# Patient Record
Sex: Female | Born: 1985 | Race: White | Hispanic: No | Marital: Married | Smoking: Former smoker
Health system: Southern US, Community
[De-identification: ages and names within clinical notes are randomized; demographics above are authoritative.]

## PROBLEM LIST (undated history)

## (undated) DIAGNOSIS — N200 Calculus of kidney: Secondary | ICD-10-CM

## (undated) DIAGNOSIS — K900 Celiac disease: Secondary | ICD-10-CM

## (undated) DIAGNOSIS — N840 Polyp of corpus uteri: Secondary | ICD-10-CM

## (undated) DIAGNOSIS — I10 Essential (primary) hypertension: Secondary | ICD-10-CM

## (undated) DIAGNOSIS — B999 Unspecified infectious disease: Secondary | ICD-10-CM

## (undated) DIAGNOSIS — U071 COVID-19: Secondary | ICD-10-CM

## (undated) DIAGNOSIS — N841 Polyp of cervix uteri: Secondary | ICD-10-CM

## (undated) DIAGNOSIS — J45909 Unspecified asthma, uncomplicated: Secondary | ICD-10-CM

## (undated) DIAGNOSIS — F419 Anxiety disorder, unspecified: Secondary | ICD-10-CM

## (undated) DIAGNOSIS — R519 Headache, unspecified: Secondary | ICD-10-CM

## (undated) DIAGNOSIS — D649 Anemia, unspecified: Secondary | ICD-10-CM

## (undated) HISTORY — PX: ADENOIDECTOMY: SUR15

## (undated) HISTORY — PX: FOOT SURGERY: SHX648

## (undated) HISTORY — PX: TONSILLECTOMY: SUR1361

## (undated) HISTORY — PX: HIP SURGERY: SHX245

---

## 2007-09-22 ENCOUNTER — Emergency Department (HOSPITAL_COMMUNITY): Admission: EM | Admit: 2007-09-22 | Discharge: 2007-09-23 | Payer: Self-pay | Admitting: Emergency Medicine

## 2008-06-19 ENCOUNTER — Encounter
Admission: RE | Admit: 2008-06-19 | Discharge: 2008-06-19 | Payer: Self-pay | Admitting: Physical Medicine and Rehabilitation

## 2012-07-19 ENCOUNTER — Encounter (HOSPITAL_COMMUNITY): Payer: Self-pay | Admitting: Emergency Medicine

## 2012-07-19 ENCOUNTER — Emergency Department (HOSPITAL_COMMUNITY): Payer: BC Managed Care – PPO

## 2012-07-19 ENCOUNTER — Emergency Department (HOSPITAL_COMMUNITY)
Admission: EM | Admit: 2012-07-19 | Discharge: 2012-07-19 | Disposition: A | Discharge status: Home / Self Care | Payer: BC Managed Care – PPO | Attending: Emergency Medicine | Admitting: Emergency Medicine

## 2012-07-19 DIAGNOSIS — Z880 Allergy status to penicillin: Secondary | ICD-10-CM | POA: Insufficient documentation

## 2012-07-19 DIAGNOSIS — J45909 Unspecified asthma, uncomplicated: Secondary | ICD-10-CM | POA: Insufficient documentation

## 2012-07-19 DIAGNOSIS — X500XXA Overexertion from strenuous movement or load, initial encounter: Secondary | ICD-10-CM | POA: Insufficient documentation

## 2012-07-19 DIAGNOSIS — Y9389 Activity, other specified: Secondary | ICD-10-CM | POA: Insufficient documentation

## 2012-07-19 DIAGNOSIS — Z79899 Other long term (current) drug therapy: Secondary | ICD-10-CM | POA: Insufficient documentation

## 2012-07-19 DIAGNOSIS — Z87891 Personal history of nicotine dependence: Secondary | ICD-10-CM | POA: Insufficient documentation

## 2012-07-19 DIAGNOSIS — S8990XA Unspecified injury of unspecified lower leg, initial encounter: Secondary | ICD-10-CM | POA: Insufficient documentation

## 2012-07-19 DIAGNOSIS — S99911A Unspecified injury of right ankle, initial encounter: Secondary | ICD-10-CM

## 2012-07-19 DIAGNOSIS — Y929 Unspecified place or not applicable: Secondary | ICD-10-CM | POA: Insufficient documentation

## 2012-07-19 HISTORY — DX: Unspecified asthma, uncomplicated: J45.909

## 2012-07-19 MED ORDER — TRAMADOL HCL 50 MG PO TABS: 50.0000 mg | ORAL_TABLET | Freq: Four times a day (QID) | ORAL | Status: DC | PRN

## 2012-07-19 MED ORDER — TRAMADOL HCL 50 MG PO TABS
50.0000 mg | ORAL_TABLET | Freq: Once | ORAL | Status: AC
  Administered 2012-07-19: 50 mg via ORAL
  Filled 2012-07-19: qty 1

## 2012-07-19 NOTE — ED Notes (Signed)
Pt escorted to discharge window. Verbalized understanding discharge instructions. In no acute distress.   

## 2012-07-19 NOTE — ED Provider Notes (Signed)
History     CSN: [id number]  Arrival date & time 07/19/12  0807   First MD Initiated Contact with Patient 07/19/12 [phone]      Chief Complaint  Patient presents with  . Ankle Injury    (Consider location/radiation/quality/duration/timing/severity/associated sxs/prior treatment) HPI Comments: Patient is a 27 year old female who presents with right ankle injury that occurred this morning. The mechanism of injury was sudden ankle inversion. Patient reports hearing a "pop" sudden onset of throbbing, severe pain that is localized to right ankle. Patient reports progressive worsening of pain. Ankle movement and weight bearing activity make the pain worse. Nothing makes the pain better. Patient reports associated swelling. Patient has not tried anything for pain relief. Patient denies obvious deformity, numbness/tingling, coolness/weakness of extremity, bruising, and any other injury.      Past Medical History  Diagnosis Date  . Asthma     Past Surgical History  Procedure Laterality Date  . Tonsillectomy    . Adenoidectomy      No family history on file.  History  Substance Use Topics  . Smoking status: Former Games developer  . Smokeless tobacco: Not on file  . Alcohol Use: No    OB History   Grav Para Term Preterm Abortions TAB SAB Ect Mult Living                  Review of Systems  Musculoskeletal: Positive for arthralgias.  All other systems reviewed and are negative.    Allergies  Avelox; Food; Penicillins; Sulfa antibiotics; and Biaxin  Home Medications   Current Outpatient Rx  Name  Route  Sig  Dispense  Refill  . citalopram (CELEXA) 20 MG tablet   Oral   Take 20 mg by mouth daily.         Marland Kitchen levocetirizine (XYZAL) 5 MG tablet   Oral   Take 5 mg by mouth every evening.         . norethindrone-ethinyl estradiol (NECON 0.5/35, 28,) 0.5-35 MG-MCG tablet   Oral   Take 1 tablet by mouth daily.           BP 140/73  Pulse 88  Temp(Src) 98.1 F (36.7 C)  (Oral)  Resp 20  SpO2 95%  LMP 06/28/2012  Physical Exam  Nursing note and vitals reviewed. Constitutional: She is oriented to person, place, and time. She appears well-developed and well-nourished. No distress.  HENT:  Head: Normocephalic and atraumatic.  Eyes: Conjunctivae are normal.  Neck: Normal range of motion.  Cardiovascular: Normal rate and regular rhythm.  Exam reveals no gallop and no friction rub.   No murmur heard. Pulmonary/Chest: Effort normal and breath sounds normal. She has no wheezes. She has no rales. She exhibits no tenderness.  Abdominal: Soft. There is no tenderness.  Musculoskeletal:  Right ankle ROM limited to due pain. Mild lateral malleolus swelling. Tenderness to palpation of right lateral malleolus. NO obvious deformity.   Neurological: She is alert and oriented to person, place, and time. Coordination normal.  Speech is goal-oriented. Moves limbs without ataxia.   Skin: Skin is warm and dry.    ED Course  Procedures (including critical care time)  Labs Reviewed - No data to display Dg Ankle Complete Right  07/19/2012   *RADIOLOGY REPORT*  Clinical Data: Recent traumatic injury with pain laterally  RIGHT ANKLE - COMPLETE 3+ VIEW  Comparison: 06/08/2009  Findings: A small well corticated density is noted adjacent to the distal aspect of the fibula unchanged from  prior exam.  This likely represents prior trauma.  No acute fracture or dislocation is noted.  No soft tissue changes are seen.  IMPRESSION: Chronic changes without acute abnormality.   Original Report Authenticated By: Alcide Clever, M.D.     No diagnosis found.    MDM  9:08 AM Ankle film unremarkable. Patient will have ASO ankle brace for support and have tramadol for pain. Patient instructed to keep ankle iced and elevated.         Emilia Beck, PA-C 07/19/12 0930

## 2012-07-19 NOTE — ED Notes (Signed)
Patient transported to X-ray 

## 2012-07-19 NOTE — ED Provider Notes (Signed)
Medical screening examination/treatment/procedure(s) were performed by non-physician practitioner and as supervising physician I was immediately available for consultation/collaboration.  Virgel Manifold, MD 07/19/12 [phone]

## 2012-07-19 NOTE — ED Notes (Signed)
Ortho at bedside.

## 2012-07-19 NOTE — ED Notes (Addendum)
Patient reports hearing a snap when she twisted her right ankle carrying laundry down the stairs this morning. Pedal pulses moderate 2+, no loss of sensation, no loss of movement, no swelling currently. Right lateral ankle is significantly warmer than the left ankle.

## 2012-07-19 NOTE — ED Notes (Signed)
PA at bedside.

## 2013-01-23 ENCOUNTER — Other Ambulatory Visit: Payer: Self-pay | Admitting: Family Medicine

## 2013-01-23 DIAGNOSIS — M549 Dorsalgia, unspecified: Secondary | ICD-10-CM

## 2013-01-24 ENCOUNTER — Ambulatory Visit
Admission: RE | Admit: 2013-01-24 | Discharge: 2013-01-24 | Disposition: A | Discharge status: Home / Self Care | Payer: BC Managed Care – PPO | Source: Ambulatory Visit | Attending: Family Medicine | Admitting: Family Medicine

## 2013-01-24 DIAGNOSIS — M549 Dorsalgia, unspecified: Secondary | ICD-10-CM

## 2013-06-29 ENCOUNTER — Ambulatory Visit
Admission: RE | Admit: 2013-06-29 | Discharge: 2013-06-29 | Disposition: A | Discharge status: Home / Self Care | Payer: BC Managed Care – PPO | Source: Ambulatory Visit | Attending: Family Medicine | Admitting: Family Medicine

## 2013-06-29 ENCOUNTER — Other Ambulatory Visit: Payer: Self-pay | Admitting: Family Medicine

## 2013-06-29 DIAGNOSIS — S0990XA Unspecified injury of head, initial encounter: Secondary | ICD-10-CM

## 2014-08-07 ENCOUNTER — Other Ambulatory Visit: Payer: Self-pay | Admitting: Family Medicine

## 2014-08-07 ENCOUNTER — Ambulatory Visit
Admission: RE | Admit: 2014-08-07 | Discharge: 2014-08-07 | Disposition: A | Discharge status: Home / Self Care | Payer: BC Managed Care – PPO | Source: Ambulatory Visit | Attending: Family Medicine | Admitting: Family Medicine

## 2014-08-07 DIAGNOSIS — M25475 Effusion, left foot: Secondary | ICD-10-CM

## 2014-08-07 DIAGNOSIS — M79672 Pain in left foot: Secondary | ICD-10-CM

## 2014-10-15 ENCOUNTER — Telehealth: Payer: Self-pay | Admitting: Cardiovascular Disease

## 2014-10-15 NOTE — Telephone Encounter (Signed)
Received records from [place] for appointment with Dr Oval Linsey on 11/06/14.  Records given to [facility] (medical records) for Dr Blenda Mounts schedule on 11/06/14. lp

## 2014-11-04 ENCOUNTER — Ambulatory Visit: Payer: BC Managed Care – PPO | Admitting: Cardiovascular Disease

## 2014-11-06 ENCOUNTER — Ambulatory Visit (INDEPENDENT_AMBULATORY_CARE_PROVIDER_SITE_OTHER): Payer: BC Managed Care – PPO | Admitting: Cardiovascular Disease

## 2014-11-06 ENCOUNTER — Encounter (INDEPENDENT_AMBULATORY_CARE_PROVIDER_SITE_OTHER): Payer: BC Managed Care – PPO

## 2014-11-06 ENCOUNTER — Telehealth: Payer: Self-pay | Admitting: Cardiovascular Disease

## 2014-11-06 ENCOUNTER — Encounter: Payer: Self-pay | Admitting: Cardiovascular Disease

## 2014-11-06 VITALS — BP 122/78 | HR 58 | Ht 70.0 in | Wt 233.0 lb

## 2014-11-06 DIAGNOSIS — R0602 Shortness of breath: Secondary | ICD-10-CM

## 2014-11-06 DIAGNOSIS — R011 Cardiac murmur, unspecified: Secondary | ICD-10-CM | POA: Diagnosis not present

## 2014-11-06 DIAGNOSIS — R002 Palpitations: Secondary | ICD-10-CM | POA: Diagnosis not present

## 2014-11-06 NOTE — Patient Instructions (Signed)
LABS- CBC,BMP [place] physician has recommended that you wear an event monitor FOR 7 DAYS . Event monitors are medical devices that record the heart's electrical activity. Doctors most often [place] these monitors to diagnose arrhythmias. Arrhythmias are problems with the speed or rhythm of the heartbeat. The monitor is a small, portable device. You can wear one while you do your normal daily activities. This is usually used to diagnose what is causing palpitations/syncope (passing out).  Your physician has requested that you have an echocardiogram AT [place] [facility]. Marland Kitchen Echocardiography is a painless test that uses sound waves to create images of your heart. It provides your doctor with information about the size and shape of your heart and how well your heart's chambers and valves are working. This procedure takes approximately one hour. There are no restrictions for this procedure.  WILL CONTACT YOU WITH RESULTS.  Your physician wants you to follow-up in [place]

## 2014-11-06 NOTE — Progress Notes (Signed)
Cardiology Office Note   Date:  11/06/2014   ID:  [name], DOB 1985-11-11, MRN [id number]  PCP:  No primary care provider on file.  Cardiologist:   Sharol Harness, MD   No chief complaint on file.     History of Present Illness: [name] is a 29 y.o. female with tobacco abuse and asthma who presents for an evaluation of palpitations and chest pain. She reports episodes of chest pain that is been occurring intermittently for as long as she can remember. She reports that the chest pain has been occurring more frequently in the last 2 months.  She describes it as a substernal, sharp/stabbing 9 out of 10 chest pain.  It is associated with palpitations and shortness of breath. She denies any lightheadedness, dizziness, nausea or diaphoresis. 2 months ago she had a particularly severe episode that occurred several times in one day. Typically occurs once every 3 or 4 days. She mentioned this to her OB/GYN, Dr. Chalmers Cater, who referred her to cardiology for further evaluation.  [name] is very physically active. She exercises several times a week by lifting weights 2-3 times per week, doing yoga once per week, and doing cardio one to 2 times per week. She has been watching her diet and eating healthfully. She drinks one cup of coffee daily but does not have any other intake of caffeine throughout the day.  The episodes are new or more nor less common with exercise  [name]'s mother has a history of mitral valve prolapse and had a TIA at age 28. She also has hyperlipidemia. Her maternal grandfather had a heart attack at age 50.   Past Medical History  Diagnosis Date  . Asthma     Past Surgical History  Procedure Laterality Date  . Tonsillectomy    . Adenoidectomy       Current Outpatient Prescriptions  Medication Sig Dispense Refill  . citalopram (CELEXA) 20 MG tablet Take 20 mg by mouth daily.    Marland Kitchen levocetirizine (XYZAL) 5 MG tablet Take 5 mg by mouth every  evening.    . norethindrone-ethinyl estradiol (NECON 0.5/35, 28,) 0.5-35 MG-MCG tablet Take 1 tablet by mouth daily.    . traMADol (ULTRAM) 50 MG tablet Take 1 tablet (50 mg total) by mouth every 6 (six) hours as needed for pain. 15 tablet 0   No current facility-administered medications for this visit.    Allergies:   Avelox; Food; Penicillins; Sulfa antibiotics; and Biaxin    Social History:  The patient  reports that she has quit smoking. She does not have any smokeless tobacco history on file. She reports that she does not drink alcohol or use illicit drugs.   Family History:  The patient's family history is not on file.    ROS:  Please see the history of present illness.   Otherwise, review of systems are positive for none.   All other systems are reviewed and negative.    PHYSICAL EXAM: VS:  There were no vitals taken for this visit. , BMI There is no height or weight on file to calculate BMI. GENERAL:  Well appearing HEENT:  Pupils equal round and reactive, fundi not visualized, oral mucosa unremarkable NECK:  No jugular venous distention, waveform within normal limits, carotid upstroke brisk and symmetric, no bruits, no thyromegaly LYMPHATICS:  No cervical adenopathy LUNGS:  Clear to auscultation bilaterally HEART:  RRR.  PMI not displaced or sustained,S1 and S2 within normal limits, no  S3, no S4, no clicks, no rubs, II/IV diastolic murmur at the LUSB. ABD:  Flat, positive bowel sounds normal in frequency in pitch, no bruits, no rebound, no guarding, no midline pulsatile mass, no hepatomegaly, no splenomegaly EXT:  2 plus pulses throughout, no edema, no cyanosis no clubbing SKIN:  No rashes no nodules NEURO:  Cranial nerves II through XII grossly intact, motor grossly intact throughout PSYCH:  Cognitively intact, oriented to person place and time    EKG:  EKG is ordered today. The ekg ordered today demonstrates sinus bradycardia at 58 bpm.     Recent Labs: No results  found for requested labs within last 365 days.    Lipid Panel No results found for: CHOL, TRIG, HDL, CHOLHDL, VLDL, LDLCALC, LDLDIRECT    Wt Readings from Last 3 Encounters:  No data found for Wt     ASSESSMENT AND PLAN:  # Palpitations:  Based on her symptoms it sounds like she has PACs or PVCs.  Will obtain a 7 day Event Monitor to assess the underlying rhythm at the time when she notes symptoms.  Will also obtain a BMP, CBC, magnesium and thyroid function.  # Diastolic murmur: [name] has a mild diastolic murmur consistent with AR.  She does not have any signs of heart failure.  She has a family history of MVP, which could explain some of her symptoms.  This is inconsistent with the murmur heart on  Exam.  Wil refer for echocardiography to assess for structural heart disease.  # Chest pain: Symptoms are very atypical and not consistent with ischemia.  She has experienced this since childhood and it is not exacerbated by exercise.  Will evaluate for mitral valve prolapse as above.   Current medicines are reviewed at length with the patient today.  The patient does not have concerns regarding medicines.  The following changes have been made:  no change  Labs/ tests ordered today include:  No orders of the defined types were placed in this encounter.     Disposition:   FU with Laval Cafaro C. Oval Linsey, MD in 3 months.   Signed, Sharol Harness, MD  11/06/2014 8:20 AM    [facility]

## 2014-11-06 NOTE — Telephone Encounter (Signed)
Questions regarding insurance answered

## 2014-11-13 LAB — MAGNESIUM: Magnesium: 1.8 mg/dL (ref 1.5–2.5)

## 2014-11-13 LAB — BASIC METABOLIC PANEL
BUN: 13 mg/dL (ref 7–25)
CO2: 29 mmol/L (ref 20–31)
Calcium: 9.1 mg/dL (ref 8.6–10.2)
Chloride: 99 mmol/L (ref 98–110)
Creat: 0.68 mg/dL (ref 0.50–1.10)
Glucose, Bld: 88 mg/dL (ref 65–99)
Potassium: 4.4 mmol/L (ref 3.5–5.3)
Sodium: 135 mmol/L (ref 135–146)

## 2014-11-13 LAB — CBC
HCT: 35.8 % — ABNORMAL LOW (ref 36.0–46.0)
Hemoglobin: 11.9 g/dL — ABNORMAL LOW (ref 12.0–15.0)
MCH: 30.3 pg (ref 26.0–34.0)
MCHC: 33.2 g/dL (ref 30.0–36.0)
MCV: 91.1 fL (ref 78.0–100.0)
MPV: 11 fL (ref 8.6–12.4)
Platelets: 223 10*3/uL (ref 150–400)
RBC: 3.93 MIL/uL (ref 3.87–5.11)
RDW: 13.2 % (ref 11.5–15.5)
WBC: 7.4 10*3/uL (ref 4.0–10.5)

## 2014-11-13 LAB — TSH: TSH: 2.279 u[IU]/mL (ref 0.350–4.500)

## 2014-11-14 ENCOUNTER — Telehealth: Payer: Self-pay | Admitting: *Deleted

## 2014-11-14 NOTE — Telephone Encounter (Signed)
Spoke to patient. Result given . Verbalized understanding  

## 2014-11-14 NOTE — Telephone Encounter (Signed)
LEFT MESSAGE TO CALL BACK WITH MOTHER

## 2014-11-14 NOTE — Telephone Encounter (Signed)
-----   Message from Chilton Si, MD sent at 11/13/2014  6:50 PM EDT ----- Labs are all normal other than mild anemia.

## 2014-11-18 ENCOUNTER — Other Ambulatory Visit: Payer: Self-pay

## 2014-11-18 ENCOUNTER — Ambulatory Visit (HOSPITAL_COMMUNITY): Payer: BC Managed Care – PPO | Attending: Cardiovascular Disease

## 2014-11-18 DIAGNOSIS — R0602 Shortness of breath: Secondary | ICD-10-CM

## 2014-11-18 DIAGNOSIS — R002 Palpitations: Secondary | ICD-10-CM

## 2014-11-18 DIAGNOSIS — I071 Rheumatic tricuspid insufficiency: Secondary | ICD-10-CM | POA: Diagnosis not present

## 2014-11-18 DIAGNOSIS — F17200 Nicotine dependence, unspecified, uncomplicated: Secondary | ICD-10-CM | POA: Insufficient documentation

## 2014-11-18 DIAGNOSIS — R011 Cardiac murmur, unspecified: Secondary | ICD-10-CM | POA: Insufficient documentation

## 2014-11-18 DIAGNOSIS — I351 Nonrheumatic aortic (valve) insufficiency: Secondary | ICD-10-CM | POA: Insufficient documentation

## 2014-11-20 ENCOUNTER — Telehealth: Payer: Self-pay | Admitting: *Deleted

## 2014-11-20 NOTE — Telephone Encounter (Signed)
Spoke to mother   cell phone number given [phone] Spoke to patient- result given  Verbalized understanding. Appointment 11/25/14 3:30 pm

## 2014-11-20 NOTE — Telephone Encounter (Signed)
-----   Message from Chilton Si, MD sent at 11/19/2014  5:25 PM EDT ----- Pressure slightly elevated in lungs.  Schedule follow up next available.

## 2014-11-20 NOTE — Telephone Encounter (Signed)
-----   Message from Chilton Si, MD sent at 11/19/2014  5:23 PM EDT ----- Normal monitor.  No significant arrhythmias.

## 2014-11-25 ENCOUNTER — Encounter: Payer: Self-pay | Admitting: Cardiovascular Disease

## 2014-11-25 ENCOUNTER — Ambulatory Visit (INDEPENDENT_AMBULATORY_CARE_PROVIDER_SITE_OTHER): Payer: BC Managed Care – PPO | Admitting: Cardiovascular Disease

## 2014-11-25 VITALS — BP 114/62 | HR 72 | Ht 70.0 in | Wt 232.0 lb

## 2014-11-25 DIAGNOSIS — I272 Other secondary pulmonary hypertension: Secondary | ICD-10-CM | POA: Diagnosis not present

## 2014-11-25 DIAGNOSIS — Z148 Genetic carrier of other disease: Secondary | ICD-10-CM

## 2014-11-25 DIAGNOSIS — Z86718 Personal history of other venous thrombosis and embolism: Secondary | ICD-10-CM

## 2014-11-25 DIAGNOSIS — E8801 Alpha-1-antitrypsin deficiency: Secondary | ICD-10-CM

## 2014-11-25 NOTE — Progress Notes (Signed)
Cardiology Office Note   Date:  11/25/2014   ID:  [name], DOB Sep 12, 1985, MRN [id number]  PCP:  Anthoney Harada, MD  Cardiologist:   Sharol Harness, MD   No chief complaint on file.     Interval History: [name] is a 29 y.o. female with asthma and prior tobacco abuse who presents for follow up on an abnormal echo.  [name] was seen in clinic on 10/5 with a complaint of palpitations and chest pain.  She was referred for a 7 day event monitor that showed no arrhythmias.  She also had an echo that showed mildly elevated PA pressure (PASP 38 mmHg) and a dilated IVC.  She has been doing well since her last appointment. She had one episode of palpitations but it was not as severe as previous episodes. She denies any chest pain, shortness of breath, orthopnea, PND, or lower extremity edema.  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 She also reports a DVT 10 years ago when she was in college. The source of her DVT was unknown.  Past Medical History  Diagnosis Date  . Asthma     Past Surgical History  Procedure Laterality Date  . Tonsillectomy    . Adenoidectomy       Current Outpatient Prescriptions  Medication Sig Dispense Refill  . citalopram (CELEXA) 20 MG tablet Take 20 mg by mouth daily.    Marland Kitchen loratadine (CLARITIN) 10 MG tablet Take 10 mg by mouth daily.    . norethindrone-ethinyl estradiol (NECON 0.5/35, 28,) 0.5-35 MG-MCG tablet Take 1 tablet by mouth daily.     No current facility-administered medications for this visit.    Allergies:   Avelox; Food; Penicillins; Sulfa antibiotics; and Biaxin    Social History:  The patient  reports that she has quit smoking. She does not have any smokeless tobacco history on file. She reports that she does not drink alcohol or use illicit drugs.   Family History:  The patient's family history includes Alzheimer's  disease in her maternal grandmother; Diabetes in her maternal grandmother; Emphysema in her maternal grandfather; Healthy in her brother and father; Heart attack (age of onset: 74) in her maternal grandfather; Hyperlipidemia in her maternal grandmother; Hyperlipidemia (age of onset: 46) in her mother; Hypertension in her paternal grandmother; Kidney disease in her maternal grandmother; Mitral valve prolapse in her mother; Other in her paternal grandmother; Skin cancer in her paternal grandfather and paternal grandmother; Thyroid disease in her mother and sister; Transient ischemic attack (age of onset: 66) in her mother.    ROS:  Please see the history of present illness.   Otherwise, review of systems are positive for none.   All other systems are reviewed and negative.    PHYSICAL EXAM: VS:  BP 114/62 mmHg  Pulse 72  Ht 5' 10"  (1.778 m)  Wt 105.235 kg (232 lb)  BMI 33.29 kg/m2 , BMI Body mass index is 33.29 kg/(m^2). GENERAL:  Well appearing HEENT:  Pupils equal round and reactive, fundi not visualized, oral mucosa unremarkable NECK:  No jugular venous distention, waveform within normal limits, carotid upstroke brisk and symmetric, no bruits, no thyromegaly LYMPHATICS:  No cervical adenopathy LUNGS:  Clear to auscultation bilaterally HEART:  RRR.  PMI not displaced or sustained,S1 and S2 within normal limits, no S3, no S4, no clicks, no rubs, II/IV diastolic murmur at the LUSB. ABD:  Flat, positive bowel sounds normal in frequency in pitch, no bruits, no rebound, no guarding, no midline pulsatile mass, no hepatomegaly, no splenomegaly EXT:  2 plus pulses throughout, no edema, no cyanosis no clubbing SKIN:  No rashes no nodules NEURO:  Cranial nerves II through XII grossly intact, motor grossly intact throughout PSYCH:  Cognitively intact, oriented to person place and time    EKG:  EKG is not ordered today.   Recent Labs: 11/12/2014: BUN 13; Creat 0.68; Hemoglobin 11.9*; Magnesium 1.8;  Platelets 223; Potassium 4.4; Sodium 135; TSH 2.279    Lipid Panel No results found for: CHOL, TRIG, HDL, CHOLHDL, VLDL, LDLCALC, LDLDIRECT    Wt Readings from Last 3 Encounters:  11/25/14 105.235 kg (232 lb)  11/06/14 105.688 kg (233 lb)     ASSESSMENT AND PLAN:  # Palpitations:  Ambulatory monitoring was unremarkable.   # Pulmonary hypertension: [name] was noted to have mild pulmonary hypertension with peak systolic pressure of 38 mmHg. She has several potential causes for this.  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 She also has a history of DVT. We will obtain a CT angiogram to rule out PE and also a CT without contrast to look at her lung parenchyma. We will obtain pulmonary function test to assess for pulmonary disease. We will also check an ESR, ANA, and HIV test. Her TSH was normal.     Current medicines are reviewed at length with the patient today.  The patient does not have concerns regarding medicines.  The following changes have been made:  no change  Labs/ tests ordered today include:   Orders Placed This Encounter  Procedures  . CT Angio Chest W/Cm &/Or Wo Cm  . Sedimentation rate  . Antinuclear Antib (ANA)  . HIV antibody (with reflex)  . Pulmonary Function Test     Disposition:   FU with Marialuiza Car C. Oval Linsey, MD in 1 year.   Signed, Sharol Harness, MD  11/25/2014 3:57 PM    [place]

## 2014-11-25 NOTE — Patient Instructions (Signed)
Your physician has recommended that you have a pulmonary function test. Pulmonary Function Tests are a group of tests that measure how well air moves in and out of your lungs.  Your physician has recommended that you have a non-Cardiac CT Angiography (CTA), is a special type of CT scan that uses a computer to produce multi-dimensional views of major blood vessels throughout the body. In CT angiography, a contrast material is injected through an IV to help visualize the blood vessels.  Your physician recommends that you return for lab work TODAY.  Dr Duke Salvia recommends that you schedule a follow-up appointment in 1 year. You will receive a reminder letter in the mail two months in advance. If you don't receive a letter, please call our office to schedule the follow-up appointment.

## 2014-11-26 ENCOUNTER — Ambulatory Visit (INDEPENDENT_AMBULATORY_CARE_PROVIDER_SITE_OTHER)
Admission: RE | Admit: 2014-11-26 | Discharge: 2014-11-26 | Disposition: A | Discharge status: Home / Self Care | Payer: BC Managed Care – PPO | Source: Ambulatory Visit | Attending: Cardiovascular Disease | Admitting: Cardiovascular Disease

## 2014-11-26 ENCOUNTER — Telehealth: Payer: Self-pay | Admitting: Cardiovascular Disease

## 2014-11-26 DIAGNOSIS — Z148 Genetic carrier of other disease: Secondary | ICD-10-CM

## 2014-11-26 DIAGNOSIS — Z86718 Personal history of other venous thrombosis and embolism: Secondary | ICD-10-CM | POA: Diagnosis not present

## 2014-11-26 DIAGNOSIS — I272 Other secondary pulmonary hypertension: Secondary | ICD-10-CM | POA: Diagnosis not present

## 2014-11-26 DIAGNOSIS — E8801 Alpha-1-antitrypsin deficiency: Secondary | ICD-10-CM

## 2014-11-26 LAB — SEDIMENTATION RATE: Sed Rate: 6 mm/hr (ref 0–20)

## 2014-11-26 LAB — ANA: Anti Nuclear Antibody(ANA): NEGATIVE

## 2014-11-26 LAB — HIV ANTIBODY (ROUTINE TESTING W REFLEX): HIV 1&2 Ab, 4th Generation: NONREACTIVE

## 2014-11-26 MED ORDER — IOHEXOL 350 MG/ML SOLN
80.0000 mL | Freq: Once | INTRAVENOUS | Status: AC | PRN
  Administered 2014-11-26: 80 mL via INTRAVENOUS

## 2014-11-26 NOTE — Telephone Encounter (Signed)
Pt had CT of chest done today. Results are ready

## 2014-11-26 NOTE — Telephone Encounter (Signed)
Marzetta Board called in stating that the results to the PE were available. Please f/u if you need to  Thank

## 2014-11-27 ENCOUNTER — Telehealth: Payer: Self-pay | Admitting: *Deleted

## 2014-11-27 NOTE — Telephone Encounter (Signed)
-----   Message from Chilton Si, MD sent at 11/26/2014  5:03 PM EDT ----- Normal lung tissue, no PE and pulmonary artery is the normal size.  Normal study.

## 2014-11-27 NOTE — Telephone Encounter (Signed)
SPOKE TO PATIENT   SHE ASKED A QUESTION CONCERNING HER DIAGNOSIS  OF PULM. HTN SHE STATES SHE HAS DONE SOME RESEARCH. SHE WANTED TO KNOW IF SHE CAN GO THROUGH WITH A PREGNANCY.  RN INFORMED HER WILL HAVE TO DEFER TO DR [place]. WILL CONTACT HER WITH ANSWER. SHE VERBALIZED UNDERSTANDING. PATIENT ALSO REQUEST ALL REPORT AND NOTE BE SENT TO DR LAVOIE - GYN AS WELL-( INFO WAS ROUTED)

## 2014-11-27 NOTE — Telephone Encounter (Signed)
-----   Message from Skeet Latch, MD sent at 11/26/2014  5:09 PM EDT ----- Normal labs.  There is no evidence of autoimmune disease.  HIV negative.

## 2014-11-27 NOTE — Telephone Encounter (Signed)
Spoke to patient. CT SCN AND LABS Result given . Verbalized understanding Patient request information to be sent gyn- Dr Roseanne Reno Routed information

## 2014-11-28 NOTE — Telephone Encounter (Signed)
Her pulmonary pressures are very mildly elevated.  They are not high enough to prevent her from having a safe pregnancy.  We would monitor her more closely if she were to become pregnant, but she does not have to avoid pregnancy.  I will be happy to forward my note to Dr. Dellis Filbert.

## 2014-11-29 NOTE — Telephone Encounter (Signed)
Spoke to patient - information given.  Verbalized understanding 

## 2014-12-02 ENCOUNTER — Ambulatory Visit (HOSPITAL_COMMUNITY)
Admission: RE | Admit: 2014-12-02 | Discharge: 2014-12-02 | Disposition: A | Discharge status: Home / Self Care | Payer: BC Managed Care – PPO | Source: Ambulatory Visit | Attending: Cardiovascular Disease | Admitting: Cardiovascular Disease

## 2014-12-02 DIAGNOSIS — I272 Other secondary pulmonary hypertension: Secondary | ICD-10-CM | POA: Diagnosis not present

## 2014-12-02 DIAGNOSIS — Z148 Genetic carrier of other disease: Secondary | ICD-10-CM

## 2014-12-02 DIAGNOSIS — E8801 Alpha-1-antitrypsin deficiency: Secondary | ICD-10-CM | POA: Insufficient documentation

## 2014-12-02 LAB — PULMONARY FUNCTION TEST
DL/VA % pred: 82 %
DL/VA: 4.44 ml/min/mmHg/L
DLCO unc % pred: 86 %
DLCO unc: 27.52 ml/min/mmHg
FEF 25-75 Post: 2.56 L/sec
FEF 25-75 Pre: 1.88 L/sec
FEF2575-%Change-Post: 36 %
FEF2575-%Pred-Post: 68 %
FEF2575-%Pred-Pre: 49 %
FEV1-%Change-Post: 10 %
FEV1-%Pred-Post: 88 %
FEV1-%Pred-Pre: 80 %
FEV1-Post: 3.31 L
FEV1-Pre: 3 L
FEV1FVC-%Change-Post: 8 %
FEV1FVC-%Pred-Pre: 75 %
FEV6-%Change-Post: 0 %
FEV6-%Pred-Post: 103 %
FEV6-%Pred-Pre: 104 %
FEV6-Post: 4.59 L
FEV6-Pre: 4.6 L
FEV6FVC-%Change-Post: 2 %
FEV6FVC-%Pred-Post: 100 %
FEV6FVC-%Pred-Pre: 98 %
FVC-%Change-Post: 1 %
FVC-%Pred-Post: 107 %
FVC-%Pred-Pre: 105 %
FVC-Post: 4.81 L
FVC-Pre: 4.72 L
Post FEV1/FVC ratio: 69 %
Post FEV6/FVC ratio: 100 %
Pre FEV1/FVC ratio: 64 %
Pre FEV6/FVC Ratio: 97 %
RV % pred: 98 %
RV: 1.66 L
TLC % pred: 106 %
TLC: 6.26 L

## 2014-12-02 MED ORDER — ALBUTEROL SULFATE (2.5 MG/3ML) 0.083% IN NEBU
2.5000 mg | INHALATION_SOLUTION | Freq: Once | RESPIRATORY_TRACT | Status: AC
  Administered 2014-12-02: 2.5 mg via RESPIRATORY_TRACT

## 2014-12-05 ENCOUNTER — Telehealth: Payer: Self-pay | Admitting: *Deleted

## 2014-12-05 DIAGNOSIS — R942 Abnormal results of pulmonary function studies: Secondary | ICD-10-CM

## 2014-12-05 NOTE — Telephone Encounter (Signed)
[name] is returning your call . Please call

## 2014-12-05 NOTE — Telephone Encounter (Signed)
-----   Message from Skeet Latch, MD sent at 12/05/2014  6:32 AM EDT ----- Mild airway obstruction, but did not respond to bronchodilators.  Please schedule follow up with pulmonology.

## 2014-12-05 NOTE — Telephone Encounter (Signed)
Left message to call back  

## 2014-12-05 NOTE — Telephone Encounter (Signed)
Spoke to patient. Result given . Verbalized understanding Patient aware pulmonology consult has been ordered. Routed  PFT information to Dr Mariane Duval patient request.

## 2014-12-23 ENCOUNTER — Encounter: Payer: Self-pay | Admitting: Internal Medicine

## 2014-12-23 ENCOUNTER — Ambulatory Visit (INDEPENDENT_AMBULATORY_CARE_PROVIDER_SITE_OTHER): Payer: BC Managed Care – PPO | Admitting: Internal Medicine

## 2014-12-23 VITALS — BP 132/74 | HR 78 | Ht 69.5 in | Wt 236.8 lb

## 2014-12-23 DIAGNOSIS — E669 Obesity, unspecified: Secondary | ICD-10-CM | POA: Diagnosis not present

## 2014-12-23 DIAGNOSIS — J4530 Mild persistent asthma, uncomplicated: Secondary | ICD-10-CM

## 2014-12-23 DIAGNOSIS — K219 Gastro-esophageal reflux disease without esophagitis: Secondary | ICD-10-CM

## 2014-12-23 DIAGNOSIS — I272 Other secondary pulmonary hypertension: Secondary | ICD-10-CM

## 2014-12-23 MED ORDER — FAMOTIDINE 20 MG PO TABS: ORAL_TABLET | ORAL | Status: DC

## 2014-12-23 MED ORDER — PANTOPRAZOLE SODIUM 40 MG PO TBEC: 40.0000 mg | DELAYED_RELEASE_TABLET | Freq: Every day | ORAL | Status: DC

## 2014-12-23 NOTE — Assessment & Plan Note (Addendum)
-   PFT's  12/02/2014  FEV1 3.31 (88 % ) ratio 69  p 10 % improvement from saba with DLCO  86 % corrects to 82 % for alv volume    With fev 1 > 80% this is either mild chronic asthma vs GOLD I copd but either way rx is the same using rule of 2's to guide rx   12/23/2014  extensive coaching HFA effectiveness =    90%   I had an extended discussion with the patient reviewing all relevant studies completed to date and  lasting 35  minutes of a 60 minute visit    1) all of her remaining symptoms are more likely related to GERD than cardiac/ pulm origin (see GERD)  2)  I  reviewed the Fletcher curve with the patient that basically indicates  if you quit smoking when your best day FEV1 is still well preserved (as is clearly  the case here)  it is highly unlikely you will progress to severe disease and informed the patient there was no medication on the market that has proven to alter the curve/ its downward trajectory  or the likelihood of progression of their disease.  Therefore stopping smoking and maintaining abstinence is the most important aspect of care, not choice of inhalers or for that matter, doctors.   So no need for anything but prn saba for now.  2) Each maintenance medication was reviewed in detail including most importantly the difference between maintenance and prns and under what circumstances the prns are to be triggered using an action plan format that is not reflected in the computer generated alphabetically organized AVS.    Please see instructions for details which were reviewed in writing and the patient given a copy highlighting the part that I personally wrote and discussed at today's ov.

## 2014-12-23 NOTE — Assessment & Plan Note (Signed)
Clinical dx with atypical CP > rx started 12/23/2014   This may well turn out to be the source of many of her chest symptoms and she is at risk based on wt, fm hx/ use of BCPS, reviewed   rec min of 4 week rx with ppi/ hs h2 and diet   See instructions for specific recommendations which were reviewed directly with the patient who was given a copy with highlighter outlining the key components.

## 2014-12-23 NOTE — Patient Instructions (Addendum)
Pantoprazole (protonix) 40 mg   Take  30-60 min before first meal of the day and Pepcid (famotidine)  20 mg one @  bedtime for at least a month to see which if any of your symptoms improve vs now but especially your chest pains  GERD (REFLUX)  is an extremely common cause of respiratory symptoms just like yours , many times with no obvious heartburn at all.    It can be treated with medication, but also with lifestyle changes including elevation of the head of your bed (ideally with 6 inch  bed blocks),  Smoking cessation, avoidance of late meals, excessive alcohol, and avoid fatty foods, chocolate, peppermint, colas, red wine, and acidic juices such as orange juice.  NO MINT OR MENTHOL PRODUCTS SO NO COUGH DROPS  USE SUGARLESS CANDY INSTEAD (Jolley ranchers or Stover's or Life Savers) or even ice chips will also do - the key is to swallow to prevent all throat clearing. NO OIL BASED VITAMINS - use powdered substitutes.   Weight control is simply a matter of calorie balance which needs to be tilted in your favor by eating less and exercising more.  To get the most out of exercise, you need to be continuously aware that you are short of breath, but never out of breath, for 30 minutes daily. As you improve, it will actually be easier for you to do the same amount of exercise  in  30 minutes so always push to the level where you are short of breath.  If this does not result in gradual weight reduction then I strongly recommend you see a nutritionist with a food diary x 2 weeks so that we can work out a negative calorie balance which is universally effective in steady weight loss programs.  Think of your calorie balance like you do your bank account where in this case you want the balance to go down so you must take in less calories than you burn up.  It's just that simple:  Hard to do, but easy to understand.  Good luck!   Consider alternative to your present bcp's   Please schedule a follow up visit in  3 months but call sooner if needed if need the rescue more than twice weekly

## 2014-12-23 NOTE — Assessment & Plan Note (Signed)
Echo 11/18/14 >    PAS 38 CTa > neg PE  PFTs 12/02/14 nl dlco   She has extremely mild PH and my main concern is that she maintain off cigs with no further w/u for now and would only repeat the echo if she fails to achieve better ex tol (she hasn't tried yet due to foot injury) and she loses wt.

## 2014-12-23 NOTE — Assessment & Plan Note (Signed)
Body mass index is 34.48    Lab Results  Component Value Date   TSH 2.279 11/12/2014     Contributing to gerd tendency/ doe/reviewed the need and the process to achieve and maintain neg calorie balance > defer f/u primary care including intermittently monitoring thyroid status

## 2014-12-23 NOTE — Progress Notes (Signed)
Subjective:     Patient ID: [name], female   DOB: April 27, 1985,     MRN: [id number]  HPI  29 yowf with lots of childhood sinus/ear infections req tubes/ adenoidectomy and allergy eval in [place] on daily inhalers since around 29 y old  for dx of asthma "outgrew the need for maint inhalers by age 29" and shots as well both as child and young adult but stopped early 29s and smoked age [age] quit around 12/03/14 with onset of worse cp/palpitations since Aug 2016 > w/u mild PH, abn pfts so referred to pulmonary clinic 12/23/2014 by cards/ Tiffany [place]/primary Wong   12/23/2014 1st [facility] office visit/ Wert   Chief Complaint  Patient presents with  . Pulmonary Consult    Referred by Dr. Skeet Latch. Pt states she was dxed with mild pulmonary hypertension approx 3 wks ago. She states that she has been having CP, SOB and cough since August 2016.  She gets SOB walking up stairs and when exposed to cold air.   rarely needs rescue, some more x one week with smoke exp and assoc min dry cough. Cp is midline, worse with cough s dysphagia or overt hb or radiation n or v or diaphoresis - sob sometimes better with saba and sometimes not    No obvious day to day or daytime variability or assoc chest tightness, subjective wheeze or overt sinus or hb symptoms. No unusual exp hx or h/o childhood pna/  or knowledge of premature birth.  Sleeping ok without nocturnal  or early am exacerbation  of respiratory  c/o's or need for noct saba. Also denies any obvious fluctuation of symptoms with weather or environmental changes or other aggravating or alleviating factors except as outlined above   Current Medications, Allergies, Complete Past Medical History, Past Surgical History, Family History, and Social History were reviewed in [employer] record.  ROS  The following are not active complaints unless bolded sore throat, dysphagia, dental problems, itching, sneezing,  nasal  congestion or excess/ purulent secretions, ear ache,   fever, chills, sweats, unintended wt loss, classically lateralizing  pleuritic or exertional cp, hemoptysis,  orthopnea pnd or leg swelling, presyncope, palpitations, abdominal pain, anorexia, nausea, vomiting, diarrhea  or change in bowel or bladder habits, change in stools or urine, dysuria,hematuria,  rash, arthralgias, visual complaints, headache, numbness, weakness or ataxia or problems with walking or coordination,  change in mood/affect or memory.                 Review of Systems     Objective:   Physical Exam   amb wf nad  Wt Readings from Last 3 Encounters:  12/23/14 236 lb 12.8 oz (107.412 kg)  11/25/14 232 lb (105.235 kg)  11/06/14 233 lb (105.688 kg)    Vital signs reviewed   HEENT: nl dentition, turbinates, and oropharynx. Nl external ear canals without cough reflex   NECK :  without JVD/Nodes/TM/ nl carotid upstrokes bilaterally   LUNGS: no acc muscle use, clear to A and P bilaterally without cough on insp or exp maneuvers   CV:  RRR  no s3 or murmur or increase in P2, no edema   ABD:  soft and nontender with nl excursion in the supine position. No bruits or organomegaly, bowel sounds nl  MS:  warm without deformities, calf tenderness, cyanosis or clubbing  SKIN: warm and dry without lesions    NEURO:  alert, approp, no deficits  CTa 11/26/14 No evidence of pulmonary embolism. Main pulmonary artery is normal in size.    Assessment:

## 2015-02-19 ENCOUNTER — Ambulatory Visit (INDEPENDENT_AMBULATORY_CARE_PROVIDER_SITE_OTHER): Payer: BC Managed Care – PPO | Admitting: Cardiovascular Disease

## 2015-02-19 ENCOUNTER — Encounter: Payer: Self-pay | Admitting: Cardiovascular Disease

## 2015-02-19 VITALS — BP 104/64 | HR 66 | Ht 62.0 in | Wt 232.0 lb

## 2015-02-19 DIAGNOSIS — R002 Palpitations: Secondary | ICD-10-CM

## 2015-02-19 DIAGNOSIS — I272 Other secondary pulmonary hypertension: Secondary | ICD-10-CM | POA: Diagnosis not present

## 2015-02-19 NOTE — Patient Instructions (Signed)
Dr Addington recommends that you follow-up with her as needed. 

## 2015-02-19 NOTE — Progress Notes (Signed)
Cardiology Office Note   Date:  02/19/2015   ID:  [name], DOB 1986-01-08, MRN [id number]  PCP:  Anthoney Harada, MD  Cardiologist:   Sharol Harness, MD   Chief Complaint  Patient presents with  . 3 month visit    pt states she had palpitations about 2 weeks ago//had foot surgery 3 weeks ago--no other Sx//has questions about protonix     Patient ID: [name] is a 30 y.o. female with asthma and prior tobacco abuse who presents for follow up on an abnormal echo.    Interval history 02/19/15: After her last appointment Ms. Gibson underwent chest CT-A which was negative for PE.  She also underwent lab testing that was negative for ANA and HIV.  TSH and ESR were within normal limits.  She saw Dr. Christinia Gully of pulmonology.  She was felt to have mild persistent chronic asthma.  He also felt that her symptoms were mostly attributable to GERD and smoking.  She was started on a trial of PPI.  She also stopped taking her birth control.  The only time she had palpitations was for 2 days after a heel surgery.  She was not taking her PPI at that time.  She denies any chest pain, shortness of breath, lower extremity edema, orthopnea or PND.  Ms. Noberto Retort had surgery on her left heel three weeks ago.  She had a bone chip and her tendon was repaired.  Since I last saw her Ms. Noberto Retort stopped smoking.  She gradually cut back from November to December and no longer smokes.  Interval History 11/25/14: Ms. Noberto Retort was seen in clinic on 10/5 with a complaint of palpitations and chest pain.  She was referred for a 7 day event monitor that showed no arrhythmias.  She also had an echo that showed mildly elevated PA pressure (PASP 38 mmHg) and a dilated IVC.  She has been doing well since her last appointment. She had one episode of palpitations but it was not as severe as previous episodes. She denies any chest pain, shortness of breath, orthopnea, PND, or lower extremity edema.  Ms. 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 She also reports a DVT 10 years ago when she was in college. The source of her DVT was unknown.  Past Medical History  Diagnosis Date  . Asthma     Past Surgical History  Procedure Laterality Date  . Tonsillectomy    . Adenoidectomy       Current Outpatient Prescriptions  Medication Sig Dispense Refill  . citalopram (CELEXA) 20 MG tablet Take 20 mg by mouth daily.    . famotidine (PEPCID) 20 MG tablet One at bedtime 30 tablet 11  . HYDROcodone-acetaminophen (NORCO/VICODIN) 5-325 MG tablet Take 1 tablet by mouth every 6 (six) hours as needed for moderate pain.    Marland Kitchen loratadine (CLARITIN) 10 MG tablet Take 10 mg by mouth daily.    . naproxen sodium (ALEVE) 220 MG tablet Take 220 mg by mouth 2 (two) times daily as needed.    . norethindrone-ethinyl estradiol (NECON 0.5/35, 28,) 0.5-35 MG-MCG tablet Take 1 tablet by mouth daily.    . pantoprazole (PROTONIX) 40 MG tablet Take 1 tablet (40 mg total) by mouth daily. Take 30-60 min before first meal of the day (Patient not taking: Reported on 02/19/2015) 30 tablet 2   No current facility-administered medications for  this visit.    Allergies:   Avelox; Cephalosporins; Food; Penicillins; Sulfa antibiotics; and Biaxin    Social History:  The patient  reports that she has quit smoking. Her smoking use included Cigarettes. She has a 4 pack-year smoking history. She has never used smokeless tobacco. She reports that she does not drink alcohol or use illicit drugs.   Family History:  The patient's family history includes Alpha-1 antitrypsin deficiency in her maternal grandfather; Alzheimer's disease in her maternal grandmother; Asthma in her mother; Cystic fibrosis in her sister; Diabetes in her maternal grandmother; Emphysema in her maternal grandfather; Healthy in her brother and father; Heart attack (age of onset: 47) in her  maternal grandfather; Hyperlipidemia in her maternal grandmother; Hyperlipidemia (age of onset: 39) in her mother; Hypertension in her paternal grandmother; Kidney disease in her maternal grandmother; Mitral valve prolapse in her mother; Other in her paternal grandmother; Skin cancer in her paternal grandfather and paternal grandmother; Thyroid disease in her mother and sister; Transient ischemic attack (age of onset: 7) in her mother.    ROS:  Please see the history of present illness.   Otherwise, review of systems are positive for none.   All other systems are reviewed and negative.    PHYSICAL EXAM: VS:  BP 104/64 mmHg  Pulse 66  Ht _0  (1.575 m)  Wt 105.235 kg (232 lb)  BMI 42.42 kg/m2 , BMI Body mass index is 42.42 kg/(m^2). GENERAL:  Well appearing HEENT:  Pupils equal round and reactive, fundi not visualized, oral mucosa unremarkable NECK:  No jugular venous distention, waveform within normal limits, carotid upstroke brisk and symmetric, no bruits, no thyromegaly LYMPHATICS:  No cervical adenopathy LUNGS:  Clear to auscultation bilaterally HEART:  RRR.  PMI not displaced or sustained,S1 and S2 within normal limits, no clicks, no m/r/g. ABD:  Flat, positive bowel sounds normal in frequency in pitch, no bruits, no rebound, no guarding, no midline pulsatile mass, no hepatomegaly, no splenomegaly EXT:  2 plus pulses throughout, no edema, no cyanosis no clubbing.  CAM walker boot on left leg. SKIN:  No rashes no nodules NEURO:  Cranial nerves II through XII grossly intact, motor grossly intact throughout PSYCH:  Cognitively intact, oriented to person place and time   EKG:  EKG is not ordered today.   Recent Labs: 11/12/2014: BUN 13; Creat 0.68; Hemoglobin 11.9*; Magnesium 1.8; Platelets 223; Potassium 4.4; Sodium 135; TSH 2.279    Lipid Panel No results found for: CHOL, TRIG, HDL, CHOLHDL, VLDL, LDLCALC, LDLDIRECT    Wt Readings from Last 3 Encounters:  02/19/15 105.235 kg  (232 lb)  12/23/14 107.412 kg (236 lb 12.8 oz)  11/25/14 105.235 kg (232 lb)     ASSESSMENT AND PLAN:  # Palpitations: Resolved with treatment of GERD.   # Pulmonary hypertension: Ms. Noberto Retort was noted to have mild pulmonary hypertension with peak systolic pressure of 38 mmHg. Laboratory testing has been unremarkable.  There is not cardiac etiology.  Per pulmonology, this is likely attributable to asthma.  She has quit smoking as advised.   Current medicines are reviewed at length with the patient today.  The patient does not have concerns regarding medicines.  The following changes have been made:  no change  Labs/ tests ordered today include:   No orders of the defined types were placed in this encounter.     Disposition:   FU with Alane Hanssen C. Oval Linsey, MD as needed.   Signed, Sharol Harness, MD  02/19/2015  4:30 PM    [facility]

## 2015-03-25 ENCOUNTER — Ambulatory Visit: Payer: BC Managed Care – PPO | Admitting: Internal Medicine

## 2016-01-07 ENCOUNTER — Other Ambulatory Visit: Payer: Self-pay | Admitting: Internal Medicine

## 2016-01-13 ENCOUNTER — Other Ambulatory Visit: Payer: Self-pay | Admitting: Internal Medicine

## 2016-02-06 ENCOUNTER — Other Ambulatory Visit: Payer: Self-pay | Admitting: Internal Medicine

## 2016-08-30 ENCOUNTER — Telehealth: Payer: Self-pay | Admitting: Cardiovascular Disease

## 2016-08-30 NOTE — Telephone Encounter (Signed)
New Message     Patient c/o Palpitations:  High priority if patient c/o lightheadedness and shortness of breath.  1. How long have you been having palpitations? For 2 weeks   2. Are you currently experiencing lightheadedness and shortness of breath lightheaded and nausea   3. Have you checked your BP and heart rate? (document readings)  They have been normal  4. Are you experiencing any other symptoms?  Painful palpitations,  Tenderness in chest

## 2016-08-30 NOTE — Telephone Encounter (Signed)
Will discuss tomorrow.

## 2016-08-30 NOTE — Telephone Encounter (Signed)
Spoke with patient regarding palpitations, SOB, lightheadedness, nausea She is scheduled to see Dr. Duke Salvia 7/31 @ 0800 Her palps stopped once she stopped gluten - discovered she has celiac disease She had hip dysplasia surgery a few weeks back and since her palpitations have increased in frequency & severity.  She reports her chest is tender where the palpitations occur Advised patient I would notify MD of her concerns but treatment plan changes (if needed) would be made after her appt tomorrow morning.

## 2016-08-31 ENCOUNTER — Ambulatory Visit (INDEPENDENT_AMBULATORY_CARE_PROVIDER_SITE_OTHER): Payer: BC Managed Care – PPO | Admitting: Cardiovascular Disease

## 2016-08-31 ENCOUNTER — Encounter: Payer: Self-pay | Admitting: Cardiovascular Disease

## 2016-08-31 VITALS — BP 100/70 | HR 83 | Ht 70.0 in | Wt 245.0 lb

## 2016-08-31 DIAGNOSIS — R002 Palpitations: Secondary | ICD-10-CM | POA: Diagnosis not present

## 2016-08-31 DIAGNOSIS — F419 Anxiety disorder, unspecified: Secondary | ICD-10-CM | POA: Diagnosis not present

## 2016-08-31 LAB — COMPREHENSIVE METABOLIC PANEL
ALT: 19 IU/L (ref 0–32)
AST: 17 IU/L (ref 0–40)
Albumin/Globulin Ratio: 1.8 (ref 1.2–2.2)
Albumin: 4.2 g/dL (ref 3.5–5.5)
Alkaline Phosphatase: 52 IU/L (ref 39–117)
BUN/Creatinine Ratio: 27 — ABNORMAL HIGH (ref 9–23)
BUN: 19 mg/dL (ref 6–20)
Bilirubin Total: 0.4 mg/dL (ref 0.0–1.2)
CO2: 20 mmol/L (ref 20–29)
Calcium: 9.1 mg/dL (ref 8.7–10.2)
Chloride: 102 mmol/L (ref 96–106)
Creatinine, Ser: 0.71 mg/dL (ref 0.57–1.00)
GFR calc Af Amer: 130 mL/min/{1.73_m2} (ref >59)
GFR calc non Af Amer: 113 mL/min/{1.73_m2} (ref >59)
Globulin, Total: 2.4 g/dL (ref 1.5–4.5)
Glucose: 79 mg/dL (ref 65–99)
Potassium: 4.9 mmol/L (ref 3.5–5.2)
Sodium: 138 mmol/L (ref 134–144)
Total Protein: 6.6 g/dL (ref 6.0–8.5)

## 2016-08-31 LAB — CBC WITH DIFFERENTIAL/PLATELET
Basophils Absolute: 0 10*3/uL (ref 0.0–0.2)
Basos: 0 %
EOS (ABSOLUTE): 0.3 10*3/uL (ref 0.0–0.4)
Eos: 3 %
Hematocrit: 37.1 % (ref 34.0–46.6)
Hemoglobin: 12.8 g/dL (ref 11.1–15.9)
Immature Grans (Abs): 0 10*3/uL (ref 0.0–0.1)
Immature Granulocytes: 0 %
Lymphocytes Absolute: 1.9 10*3/uL (ref 0.7–3.1)
Lymphs: 23 %
MCH: 31.2 pg (ref 26.6–33.0)
MCHC: 34.5 g/dL (ref 31.5–35.7)
MCV: 91 fL (ref 79–97)
Monocytes Absolute: 0.7 10*3/uL (ref 0.1–0.9)
Monocytes: 8 %
Neutrophils Absolute: 5.3 10*3/uL (ref 1.4–7.0)
Neutrophils: 66 %
Platelets: 246 10*3/uL (ref 150–379)
RBC: 4.1 x10E6/uL (ref 3.77–5.28)
RDW: 13.5 % (ref 12.3–15.4)
WBC: 8.1 10*3/uL (ref 3.4–10.8)

## 2016-08-31 LAB — TSH: TSH: 2.46 u[IU]/mL (ref 0.450–4.500)

## 2016-08-31 LAB — MAGNESIUM: Magnesium: 1.8 mg/dL (ref 1.6–2.3)

## 2016-08-31 LAB — T4, FREE: Free T4: 1.34 ng/dL (ref 0.82–1.77)

## 2016-08-31 NOTE — Progress Notes (Signed)
  Cardiology Office Note   Date:  08/31/2016   ID:  [name], DOB 12/23/1985, MRN [id number]  PCP:  Wong, Francis P, MD  Cardiologist:   Tiffany [place], MD   Chief Complaint  Patient presents with  . Follow-up    paplitations, chest pain, light headed, nausea     Patient ID: [name] is a 30 y.o. female with palpitations, asthma, prior DVT, Celiac disease, and prior tobacco abuse who presents for follow up.  She was initially seen in 2016 regarding palpitations and chest pain.  She was referred for a 7 day event monitor that showed no arrhythmias.  She also had an echo that showed mildly elevated PA pressure (PASP 38 mmHg) and a dilated IVC.  She had a chest CT that was negative for PE.  She also underwent lab testing that was negative for ANA and HIV.  TSH and ESR were within normal limits.  She saw Dr. Michael Wert of pulmonology.  She was felt to have mild persistent chronic asthma.  She was treated with a PPI and discovered that she had Celiac disease.  After stopping gluten products her palpitations stopped.   For the last two weeks she has noted palpitations every other day and then every day.  Three days ago they were constant throughout the day. When it occurs she feels like there is a tight ball in the center of her chest. She also gets lightheaded but not dizzy. She denies syncope or presyncope. The episodes typically last for about 10-20 seconds. They are associated with shortness of breath. The symptoms are nonexertional.  She denies lower extremity edema, orthopnea or PND.  Of note, [name] had hip surgery on 7/3. One month ago she also started tapering her citalopram. She had her husband are planning to get pregnant. Last week and her dose was reduced to 5 mg and this week she has completely stopped. She also endorses frequent menstrual spotting that has been going on for over a month.    Past Medical History:  Diagnosis Date  . Asthma     Past Surgical  History:  Procedure Laterality Date  . ADENOIDECTOMY    . TONSILLECTOMY       Current Outpatient Prescriptions  Medication Sig Dispense Refill  . B Complex Vitamins (VITAMIN B COMPLEX PO) Take by mouth daily.    . Calcium-Magnesium-Zinc (CAL-MAG-ZINC PO) Take by mouth daily.    . Cholecalciferol (VITAMIN D3) 2000 units capsule Take 2,000 Units by mouth daily.    . diazepam (VALIUM) 5 MG tablet Take 5 mg by mouth.    . HYDROcodone-acetaminophen (NORCO/VICODIN) 5-325 MG tablet Take 1 tablet by mouth every 6 (six) hours as needed for moderate pain.    . naproxen (NAPROSYN) 500 MG tablet Take 500 mg by mouth 2 (two) times daily.     No current facility-administered medications for this visit.     Allergies:   Avelox [moxifloxacin]; Cephalosporins; Food; Penicillins; Sulfa antibiotics; and Biaxin [clarithromycin]    Social History:  The patient  reports that she has quit smoking. Her smoking use included Cigarettes. She has a 4.00 pack-year smoking history. She has never used smokeless tobacco. She reports that she does not drink alcohol or use drugs.   Family History:  The patient's family history includes Alpha-1 antitrypsin deficiency in her maternal grandfather; Alzheimer's disease in her maternal grandmother; Asthma in her mother; Cystic fibrosis in her sister; Diabetes in her maternal grandmother; Emphysema in her   maternal grandfather; Healthy in her brother and father; Heart attack (age of onset: 63) in her maternal grandfather; Hyperlipidemia in her maternal grandmother; Hyperlipidemia (age of onset: 50) in her mother; Hypertension in her paternal grandmother; Kidney disease in her maternal grandmother; Mitral valve prolapse in her mother; Other in her paternal grandmother; Skin cancer in her paternal grandfather and paternal grandmother; Thyroid disease in her mother and sister; Transient ischemic attack (age of onset: 58) in her mother.    ROS:  Please see the history of present  illness.   Otherwise, review of systems are positive for none.   All other systems are reviewed and negative.    PHYSICAL EXAM: VS:  BP 100/70   Pulse 83   Ht 5' 10" (1.778 m)   Wt 111.1 kg (245 lb)   BMI 35.15 kg/m  , BMI Body mass index is 35.15 kg/m. GENERAL:  Well appearing HEENT: Pupils equal round and reactive, fundi not visualized, oral mucosa unremarkable NECK:  No jugular venous distention, waveform within normal limits, carotid upstroke brisk and symmetric, no bruits, no thyromegaly LUNGS:  Clear to auscultation bilaterally HEART:  RRR.  PMI not displaced or sustained,S1 and S2 within normal limits, no S3, no S4, no clicks, no rubs, no murmurs ABD:  Flat, positive bowel sounds normal in frequency in pitch, no bruits, no rebound, no guarding, no midline pulsatile mass, no hepatomegaly, no splenomegaly EXT:  2 plus pulses throughout, no edema, no cyanosis no clubbing SKIN:  No rashes no nodules NEURO:  Cranial nerves II through XII grossly intact, motor grossly intact throughout PSYCH:  Cognitively intact, oriented to person place and time   EKG:  EKG is ordered today. 08/31/16: Sinus rhythm. Rate 83 bpm.  Recent Labs: No results found for requested labs within last 8760 hours.    Lipid Panel No results found for: CHOL, TRIG, HDL, CHOLHDL, VLDL, LDLCALC, LDLDIRECT    Wt Readings from Last 3 Encounters:  08/31/16 111.1 kg (245 lb)  02/19/15 105.2 kg (232 lb)  12/23/14 107.4 kg (236 lb 12.8 oz)     ASSESSMENT AND PLAN:  # Palpitations: Palpitations initially resolved with treatment of GERD. Her symptoms have recurred in the last month. This coincides with the time that she stopped taking her antidepressant. I suspect that her symptoms are related to anxiety. Adjusted that she talk with her primary care physician and consider referral for cognitive behavioral therapy during the time period that she will not be able to anxiety medication we will check a CBC, CMP,  magnesium, and thyroid function to rule out other contributing factors for palpitations. We discussed possibly trying metoprolol. However, given her low blood pressure and lack of any demonstrated arrhythmias this is likely to make her worse rather than better.  She expressed understanding.   Current medicines are reviewed at length with the patient today.  The patient does not have concerns regarding medicines.  The following changes have been made:  no change  Labs/ tests ordered today include:   Orders Placed This Encounter  Procedures  . CBC with Differential/Platelet  . Magnesium  . T4, free  . TSH  . Comprehensive metabolic panel  . EKG 12-Lead     Disposition:   FU with Tiffany C. [place], MD as needed.   Signed, Tiffany [place], MD  08/31/2016 8:56 AM    [facility] 

## 2016-08-31 NOTE — Patient Instructions (Addendum)
Medication Instructions:  Your physician recommends that you continue on your current medications as directed. Please refer to the Current Medication list given to you today.  Labwork: CBC/CMET/TSH/FT4/MAGNESIUM TODAY   Testing/Procedures: NONE  Follow-Up: AS NEEDED

## 2016-10-13 ENCOUNTER — Encounter (HOSPITAL_COMMUNITY): Payer: Self-pay

## 2016-10-14 ENCOUNTER — Ambulatory Visit (HOSPITAL_COMMUNITY): Payer: BC Managed Care – PPO

## 2016-10-14 ENCOUNTER — Ambulatory Visit (HOSPITAL_COMMUNITY)
Admission: RE | Admit: 2016-10-14 | Discharge: 2016-10-14 | Disposition: A | Discharge status: Home / Self Care | Payer: BC Managed Care – PPO | Source: Ambulatory Visit | Attending: Obstetrics and Gynecology | Admitting: Obstetrics and Gynecology

## 2016-10-14 ENCOUNTER — Telehealth: Payer: Self-pay | Admitting: Cardiovascular Disease

## 2016-10-14 ENCOUNTER — Telehealth: Payer: Self-pay | Admitting: Internal Medicine

## 2016-10-14 DIAGNOSIS — I272 Pulmonary hypertension, unspecified: Secondary | ICD-10-CM

## 2016-10-14 DIAGNOSIS — J4530 Mild persistent asthma, uncomplicated: Secondary | ICD-10-CM

## 2016-10-14 DIAGNOSIS — Z3169 Encounter for other general counseling and advice on procreation: Secondary | ICD-10-CM

## 2016-10-14 DIAGNOSIS — Z8489 Family history of other specified conditions: Secondary | ICD-10-CM

## 2016-10-14 DIAGNOSIS — Z315 Encounter for genetic counseling: Secondary | ICD-10-CM | POA: Insufficient documentation

## 2016-10-14 HISTORY — DX: Celiac disease: K90.0

## 2016-10-14 HISTORY — DX: Essential (primary) hypertension: I10

## 2016-10-14 NOTE — Telephone Encounter (Addendum)
Spoke with pt, she states a provider is going to reach out to MW, she has pulmonary hypertension to see how much risk it is to have a child. She needs an updated echo to evaluate lung pressure and PFT. She states the notes should be in her chart. It looks like she saw Dr. Rhoderick Moody at [facility]. Can we order tests? MW please advise.

## 2016-10-14 NOTE — Telephone Encounter (Signed)
Pt notified. She states that she went to her Dentist for consult for invitro and he said that he "didn't want to step on any toes" so he would let you order ECHO and refer to pulmonary or order PFT testing. Do you want to order this? Please advise

## 2016-10-14 NOTE — Telephone Encounter (Signed)
Follow up     Pt is returning call to nurse. Please call.

## 2016-10-14 NOTE — Progress Notes (Signed)
30yo P0 here for preconceptual counseling. She has already seen the genetic counselor due to the family history of cystic fibrosis and alpha-1-antitrypsin deficiency. She has had an extensive workup in the [facility] system for her asthma and subsequent diagnosis of pulmonary hypertension. She has chronic asthma since infancy and sees Dr. Sherene Sires. Her last visit with him was in 2016 and had a FEV1 >80%. He recommend smoking cessation, which she did for a while but has recently restarted and is smoking c. 4 cigarettes per day. During a palpitation workup she had echocardiogram that showed a PA pressure of , which Dr. Sherene Sires refers to as "very mild PH", and her cardiologist, Dr. Duke Salvia, referred to as "mildly elevated". A full workup for the cause of her PH was negative except for her chronic pulmonary disease. She had a "normal" treadmill stress test a [facility] in 2017 She reports fairly normal ADL and appears to be NYHA class I, but has had a drop in her mobility due to recent hip arthroscopy. We had a discussion about her pulmonary hypertension. She had the erroneous impression that she had a very hight prgnancy mortality rate, 50%. I let her know that that was an accurate mortality rate for women with Eisenmenger's complex, but she did not have a nonrestrictive left-to-right shunt. Her mortality risk is NOT unappreciable however. We normally do not recommend pregnancy in women with pulmonary pressures >82mmHg. I would estimate her risk of maternal mortality to be c. 5%. However, this is based on data over a year old, and with some modifying events in the intervening time frame. I have asked her to contact Dr. Sherene Sires for a visit, and an FEV1 would be very helpful to determine if she has had diminution of her pulmonary function in the intervening time frame. Most importantly, I have asked her to contact Dr. Duke Salvia for a repeat visit and in particular an echocardiogram to get a more recent assessment of her  PASP. Once we have these numbers we can better inform her of her risk for morbidity and mortality during a subsequent pregnancy. Thios can be done via telephone and should not require a repeat outpatient visit.  Thank you for the opportunity to be a part of the care of Ms. Fayrene Fearing. Please contact our office if we can be of further assistance.   I spent approximately 30 minutes with this patient with over 50% of time spent in face-to-face counseling.

## 2016-10-14 NOTE — Telephone Encounter (Signed)
Returned call to patient no answer.LMTC. 

## 2016-10-14 NOTE — Telephone Encounter (Signed)
Spoke with the pt and notified of recs per MW  She states that she is wanting to go ahead and get the tests scheduled  I have ordered the ECHO and advised that once they call her to schedule we can coordinate the PFTand ov with BQ  She verbalized understanding  Nothing further needed

## 2016-10-14 NOTE — Telephone Encounter (Signed)
New Message  Pt call requesting to speak with RN. Pt states she went to seen a genetics counselor at [facility] today and states he will be putting some recommendations in pts Mychart for orders to be placed. Pt states the provider would rather pt get the test completed at her cardiology office. Please call back to discuss

## 2016-10-14 NOTE — Telephone Encounter (Signed)
I'm OK with ordering an echo and PFT for mild pulmonary hypertension.  This is not at a level where I would have any concern with her ability to become pregnant or to pass on any congenital abnormalities to a child.

## 2016-10-14 NOTE — Telephone Encounter (Signed)
We can order the tests but really can't make any comments on risks of pregnancy s formal ov preferably with mcQuaid but if in a hurry to get a rec I can certainly serve as a bridge and initial screener

## 2016-10-15 DIAGNOSIS — Z8489 Family history of other specified conditions: Secondary | ICD-10-CM | POA: Insufficient documentation

## 2016-10-15 NOTE — Telephone Encounter (Signed)
Spoke with patient and Dr Sherene Sires already ordered for patient

## 2016-10-15 NOTE — Progress Notes (Signed)
Genetic Counseling  Preconception Note  Appointment Date:  10/15/2016 Referred By: Wanda Bigger, MD Date of Birth:  1985-04-04   Pregnancy History: [id number] Attending: Griffin Dakin, MD   I met with [name] for genetic counseling because of a family history of cystic fibrosis carrier status and alpha-1 antitrypsin deficiency.  In summary:  Discussed family history of cystic fibrosis carrier for sister  Reviewed autosomal recessive inheritance  Patient has 1 in 2 (50%) chance to also be a CF carrier  She elected to pursue CF carrier screening today ([facility])  Discussed family history of alpha-1 antitrypsin deficiency  Patient's maternal grandfather with alpha-1 antitrypsin deficiency  Discussed options of screening / testing  Discussed general population carrier screening options - patient elected to pursue expanded carrier screening panel ([facility] 175 conditions)  We began by reviewing the family history in detail. [name] reported that her sister was identified to be a cystic fibrosis carrier during routine screening in pregnancy. The patient did not have information regarding the specific variant at the time of today's visit.   We spent time reviewing genes and the autosomal recessive inheritance of cystic fibrosis.  We discussed that there are thought to be thousands of mutations which can cause the CF gene to not function properly. CF results in thickened secretions in the lungs, digestive and reproductive systems.  This life-limiting condition is characterized by chronic respiratory infections necessitating daily chest therapies, pancreatic dysfunction disrupting the body's ability to break down food and extract nutrients as it should, and possible infertility in males.  With the advent of new therapies, the average lifespan for people with CF is in the 30's.  Symptoms can be considerably variable, and in some cases symptoms may be quite mild.   Sometimes, the specific mutations a person carries can indicate whether their symptoms may be associated with milder or more severe symptoms; this is not always the case. We discussed that when both parents are identified CF carriers, then each pregnancy together has a 1 in 4 (25%) chance to inherit CF, 1 in 2 (50%) chance to be a carrier, and a 1 in 4 (25%) chance to be neither a carrier nor affected. Given the reported family history, [name] would have a 1 in 2 (50%) chance to also be a CF carrier. Her husband does not have a known family history of CF and no known consanguinity to the patient, so would have the general population risk to be a CF carrier.   Carrier screening assesses for the most common disease causing CFTR mutations and can also be performed by CFTR sequencing. Thus, a negative carrier screen would significantly reduce but not eliminate the chance to be a carrier. After careful consideration, [name] elected to proceed with CF carrier screening at the time of today's visit via CFTR sequencing through [name].   Additionally, [name] reported that her maternal grandfather had alpha-1 antitrypsin. He died at age 100 years old due to a heart attack related to chronic obstructive pulmonary disease (COPD). He reportedly was a smoker and lived near a [employer]. He also had rheumatoid arthritis. No additional relatives have been evaluated or diagnosed with alpha-1 antitrypsin deficiency. [name] reported a personal history of asthma, smoking and pulmonary hypertension. Her medical history was discussed in more detail during MFM consultation today; see separate MFM consult note from Dr. Lucia Edwards for detailed discussion.   Alpha-1 antitrypsin deficiency (AATD) is characterized by an increased  risk for chronic obstructive pulmonary disease and liver disease. Smoking is reported to be a major factor influencing the course of COPD, with onset of respiratory disease in smokers  with AATD typically between ages 61 and 65 years and in non-smokers onset delayed to sixth decade to asymptomatic. Diagnosis is typically performed by demonstration of low serum concentration of alpha-1 antritrypsin plus either detection of functionally deficient AAT protein variant by protease inhibitor (PI) typing or detection of biallelic pathogenic variants in SERPINA1 gene. We reviewed that this condition follows autosomal recessive inheritance. Severity of alpha-1 antitrypsin varies by genotype and by environmental factors. The typical allele is often referred to as M, or PI*M. The pathogenic allele, is typically referred to as PI*Z, with a less common pathogenic allele denoted as PI*S, that may have clinical consequences when present as a compound heterozygote with PI*Z. We do not have documentation of the diagnosis for the patient's grandfather nor the specific alleles he had. Given the reported family history, [name] has a 1 in 2 chance to be a carrier, or heterozygous for alpha-1 antitrypsin. It is estimated that 3% of [name] are PI*MZ heterozygotes, and generally there is not reported to be a significant increased risk for clinical disease for carriers. However, there have been reported of some heterozygotes at risk for accelerated lung disease. Given the carrier frequency in the general population, there is also a less likely chance for [name] to be homozygous for a pathogenic allele or compound heterozygous. Carrier testing is available via molecular testing or can also be performed through protease inhibitor (PI) typing by isoelectric focusing of serum. [name] declined testing for alpha-1 antitrypsin at this time. Additional information regarding this family history may alter recurrence risk assessment.   [name] reported that her partner has a family history of breast cancer and relatives who are positive for mutation in BRCA gene. Specifically, [name]' mother had breast  cancer and tested positive for a mutation in BRCA; the patient did not have information regarding which particular BRCA gene was found to have the pathogenic variant. Additionally, [name]' maternal uncle and maternal aunt had breast cancer, as did his maternal grandfather. [name] reportedly does not have a personal history of cancer, and he has not elected to pursue genetic testing for the reported familial BRCA pathogenic variant. We discussed patterns of breast cancer including sporadic, multifactorial, and hereditary. We discussed the genetics of hereditary breast and ovarian cancer including chromosomes, genes, gene mutations, and tumor suppressor genes.  We explained that BRCA1 and BRCA2 are tumor suppressor genes and that mutations in these genes are associated with an increased chance of breast and ovarian cancer, as well as increased risk for additional types of cancer. Given this reported family history, [name] would have a 1 in 2 (50%) chance to also have the same reported BRCA pathogenic variant. If he has a variant, then his offspring would also be at increased risk to inherit this gene change. Should [name] be interested in more detailed discussion regarding this family history and a detailed discussion regarding risks versus benefits of pursuing genetic testing for himself, he may be referred to a genetic counselor in the cancer center.   The family histories were otherwise found to be noncontributory for birth defects, intellectual disability, and known genetic conditions. Without further information regarding the provided family history, an accurate genetic risk cannot be calculated. Further genetic counseling is warranted if more information is obtained.  [name]  was provided with written information regarding cystic fibrosis (CF), spinal muscular atrophy (SMA) and hemoglobinopathies including the carrier frequency, availability of carrier screening and prenatal diagnosis if  indicated.  In addition, we discussed that CF and hemoglobinopathies are routinely screened for as part of the [place] newborn screening panel.  We discussed that each person is estimated to have 7-10 genes that do not work correctly, meaning that each person is estimated to be a carrier of 7-10 different genetic conditions. They were counseled that the majority of these conditions follow autosomal recessive inheritance. We reviewed that we each have two copies of all of our genes, one inherited from each parent. If one copy of the pair of genes is changed in a way that causes it not to function properly, but the other copy of the gene works, then a person is called a "carrier" for that condition. However, because the other copy of the gene works correctly, the person typically does not have any health problems related to the non-working gene(s). It is only when BOTH copies of the pair of genes do not work correctly that a person will have the condition and the related health problems.   There are a myriad of genetic disorders that occur more frequently in specific ethnic groups, those which can be traced to particular geographic locations. We discussed that although these genetic disorders are much more prevalent in specific ethnic groups, as families are becoming increasingly multiracial and multicultural, these conditions can occur in anyone from any race or ethnicity. For this reason, we discussed the availability of ethnic specific genetic carrier screening, professional society (ACOG) recommended carrier testing, and pan-ethnic carrier screening.   We reviewed that ACOG currently recommends that all patients be offered carrier screening for cystic fibrosis, spinal muscular atrophy and hemoglobinopathies. In addition, they were counseled that there are a variety of genetic screening laboratories that have pan-ethnic, or expanded, carrier screening panels, which evaluate carrier status for a wide range of genetic  conditions. Some of these conditions are severe and actionable, but also rare; others occur more commonly, but are less severe. We discussed that testing options range from screening for a single condition to panels of more than 200 autosomal or X-linked genetic conditions. We reviewed that the prevalence of each condition varies (and often varies with ethnicity). Thus the couples' background risk to be a carrier for each of these various conditions would range, and in some cases be very low or unknown. Similarly, the detection rate varies with each condition and also varies in some cases with ethnicity, ranging from greater than 99% (in the case of hemoglobinopathies) to unknown. We reviewed that a negative carrier screen would thus reduce, but not eliminate the chance to be a carrier for these conditions. For some conditions included on specific pan-ethnic carrier screening panels, the pre-test carrier frequency and/or the detection rate is unknown. Thus, for some conditions, the exact reduction of risk with a negative carrier screening result may not be able to be quantified. We reviewed that in the event that one partner is found to be a carrier for one or more conditions, carrier screening would be available to the partner for those conditions. We also discussed that for the majority of conditions, identification of carrier status is not associated with health implications for that individuals but that there are exceptions and that carrier status for some conditions on the panel have been found to be associated risk increased chance for certain potential medical implications. We discussed  the risks, benefits, and limitations of carrier screening with the patient. After thoughtful consideration of their options, [name] elected to have the universal carrier screening panel through [facility] laboratory. This panel screens for carrier status of 175 hereditary disorders. We discussed that results will be  available in approximately 2 weeks.   [name] was seen for Maternal Fetal Medicine consultation today given her personal history of pulmonary hypertension. See separate MFM consult note for detailed discussion of patient's medical history.    I counseled Mrs. Amedeo Kinsman regarding the above risks and available options.  The approximate face-to-face time with the genetic counselor was 40 minutes.  Chipper Oman, MS Certified Genetic Counselor 10/15/2016

## 2016-10-18 ENCOUNTER — Ambulatory Visit (INDEPENDENT_AMBULATORY_CARE_PROVIDER_SITE_OTHER): Payer: BC Managed Care – PPO

## 2016-10-18 ENCOUNTER — Ambulatory Visit (HOSPITAL_COMMUNITY)
Admission: EM | Admit: 2016-10-18 | Discharge: 2016-10-18 | Disposition: A | Discharge status: Home / Self Care | Payer: BC Managed Care – PPO | Attending: Urgent Care | Admitting: Urgent Care

## 2016-10-18 ENCOUNTER — Encounter (HOSPITAL_COMMUNITY): Payer: Self-pay | Admitting: Emergency Medicine

## 2016-10-18 DIAGNOSIS — R0789 Other chest pain: Secondary | ICD-10-CM

## 2016-10-18 DIAGNOSIS — R05 Cough: Secondary | ICD-10-CM | POA: Diagnosis not present

## 2016-10-18 DIAGNOSIS — R062 Wheezing: Secondary | ICD-10-CM

## 2016-10-18 DIAGNOSIS — J4520 Mild intermittent asthma, uncomplicated: Secondary | ICD-10-CM | POA: Diagnosis not present

## 2016-10-18 DIAGNOSIS — R0602 Shortness of breath: Secondary | ICD-10-CM

## 2016-10-18 DIAGNOSIS — R059 Cough, unspecified: Secondary | ICD-10-CM

## 2016-10-18 MED ORDER — HYDROCODONE-HOMATROPINE 5-1.5 MG/5ML PO SYRP: 5.0000 mL | ORAL_SOLUTION | Freq: Four times a day (QID) | ORAL | 0 refills | Status: DC | PRN

## 2016-10-18 NOTE — ED Triage Notes (Signed)
Received antibiotic on Friday (pcp), received steroids at Saturday visit (pcp).  Patient also received a breathing treatment at each pcp visit.  Today is no better, sent for evaluation of possible pneumonia.

## 2016-10-18 NOTE — ED Provider Notes (Signed)
MRN: [id number] DOB: 13-Apr-1985  Subjective:   [name] is a 31 y.o. female presenting for chief complaint of Bronchitis  Reports 2 week history of productive cough, chest tightness, shob, wheezing. Cough elicits belly pain. She is almost finished with a course of azithromycin, is completing a steroid course as well. She is using her albuterol inhaler every 5 hours. Has also been using Hycodan cough syrup. Denies fever, sore throat, n/v, rashes. Quit smoking cigarettes. Patient also has a history of pulmonary HTN, celiac disease.   No current facility-administered medications for this encounter.   Current Outpatient Prescriptions:  .  ALBUTEROL IN, Inhale into the lungs., Disp: , Rfl:  .  azithromycin (ZITHROMAX) 250 MG tablet, Take by mouth daily., Disp: , Rfl:  .  HYDROcodone-homatropine (HYCODAN) 5-1.5 MG/5ML syrup, Take 5 mLs by mouth every 6 (six) hours as needed for cough., Disp: , Rfl:  .  Probiotic Product (SOLUBLE FIBER/PROBIOTICS) CHEW, Chew by mouth., Disp: , Rfl:  .  B Complex Vitamins (VITAMIN B COMPLEX PO), Take by mouth daily., Disp: , Rfl:  .  Calcium-Magnesium-Zinc (CAL-MAG-ZINC PO), Take by mouth daily., Disp: , Rfl:  .  Cholecalciferol (VITAMIN D3) 2000 units capsule, Take 2,000 Units by mouth daily., Disp: , Rfl:  .  diazepam (VALIUM) 5 MG tablet, Take 5 mg by mouth., Disp: , Rfl:  .  HYDROcodone-acetaminophen (NORCO/VICODIN) 5-325 MG tablet, Take 1 tablet by mouth every 6 (six) hours as needed for moderate pain., Disp: , Rfl:  .  naproxen (NAPROSYN) 500 MG tablet, Take 500 mg by mouth 2 (two) times daily., Disp: , Rfl:    [name] is allergic to avelox [moxifloxacin]; cephalosporins; food; penicillins; sulfa antibiotics; and biaxin [clarithromycin].  [name]  has a past medical history of Asthma; Celiac disease; and Hypertension. Also  has a past surgical history that includes Tonsillectomy; Adenoidectomy; and Foot surgery (Left).  Objective:   Vitals: BP (!)  94/57 (BP Location: Left Arm) Comment: large cuff  Pulse 74   Temp 98.1 F (36.7 C) (Oral)   Resp 20   LMP 10/11/2016   SpO2 97%   Physical Exam  Constitutional: She is oriented to person, place, and time. She appears well-developed and well-nourished.  HENT:  TM's intact bilaterally, no effusions or erythema. Nasal turbinates pink and moist, nasal passages patent. No sinus tenderness. Pharyngeal wheezing noted. Oropharynx with slight post-nasal drainage, mucous membranes moist.   Eyes: Right eye exhibits no discharge. Left eye exhibits no discharge.  Neck: Normal range of motion. Neck supple.  Cardiovascular: Normal rate, regular rhythm and intact distal pulses.  Exam reveals no gallop and no friction rub.   No murmur heard. Pulmonary/Chest: No respiratory distress. She has no wheezes. She has no rales.  Lymphadenopathy:    She has no cervical adenopathy.  Neurological: She is alert and oriented to person, place, and time.  Skin: Skin is warm and dry.   Dg Chest 2 View  Result Date: 10/18/2016 CLINICAL DATA:  Cough and congestion with chest tightness EXAM: CHEST  2 VIEW COMPARISON:  Chest CT November 26, 2014 FINDINGS: Lungs are clear. Heart size and pulmonary vascularity are normal. No pneumothorax. No adenopathy. No bone lesions. IMPRESSION: No edema or consolidation. Electronically Signed   By: Bretta Bang III M.D.   On: 10/18/2016 17:30   Assessment and Plan :   Cough  Chest tightness  Shortness of breath  Wheezing  Mild intermittent extrinsic asthma without complication   Patient is on good regimen  of azithromycin, prednisone course. She is also scheduling albuterol. There is not much leeway given her allergies to antibiotics. Patient will maintain current regimen, add antihistamine for any sinus allergy component, take Sudafed for the same. Return-to-clinic precautions discussed, patient verbalized understanding.   Wallis Bamberg, PA-C [facility]   10/18/2016  4:26 PM    Wallis Bamberg, PA-C 10/18/16 1748

## 2016-10-22 ENCOUNTER — Ambulatory Visit (HOSPITAL_COMMUNITY): Payer: BC Managed Care – PPO | Attending: Cardiology

## 2016-10-22 ENCOUNTER — Telehealth: Payer: Self-pay | Admitting: Internal Medicine

## 2016-10-22 ENCOUNTER — Other Ambulatory Visit: Payer: Self-pay

## 2016-10-22 DIAGNOSIS — I272 Pulmonary hypertension, unspecified: Secondary | ICD-10-CM | POA: Insufficient documentation

## 2016-10-22 DIAGNOSIS — Z87891 Personal history of nicotine dependence: Secondary | ICD-10-CM | POA: Insufficient documentation

## 2016-10-22 DIAGNOSIS — I071 Rheumatic tricuspid insufficiency: Secondary | ICD-10-CM | POA: Insufficient documentation

## 2016-10-22 NOTE — Progress Notes (Signed)
LMTCB

## 2016-10-22 NOTE — Telephone Encounter (Signed)
Spoke with pt, and advised I did not see any notes about any phone call but she said Verlon Au called her today. Verlon Au do you remember why you called pt?

## 2016-10-22 NOTE — Telephone Encounter (Signed)
Call patient : Study is unremarkable - no evidence of Pulmonary htn so no need to see McQuaid - set her up to see me instead to assess risk of pregnancy   Spoke with pt and notified of results per Dr. Sherene Sires. Pt verbalized understanding and denied any questions. OV with MW scheduled for 10/28/16 at 4:30 pm

## 2016-10-22 NOTE — Progress Notes (Signed)
Spoke with pt and notified of results per Dr. Wert. Pt verbalized understanding and denied any questions. 

## 2016-10-28 ENCOUNTER — Encounter: Payer: Self-pay | Admitting: Internal Medicine

## 2016-10-28 ENCOUNTER — Telehealth (HOSPITAL_COMMUNITY): Payer: Self-pay | Admitting: MS"

## 2016-10-28 ENCOUNTER — Ambulatory Visit (INDEPENDENT_AMBULATORY_CARE_PROVIDER_SITE_OTHER): Payer: BC Managed Care – PPO | Admitting: Internal Medicine

## 2016-10-28 VITALS — BP 112/74 | HR 79 | Ht 70.0 in | Wt 246.0 lb

## 2016-10-28 DIAGNOSIS — J4530 Mild persistent asthma, uncomplicated: Secondary | ICD-10-CM | POA: Diagnosis not present

## 2016-10-28 DIAGNOSIS — E669 Obesity, unspecified: Secondary | ICD-10-CM | POA: Diagnosis not present

## 2016-10-28 DIAGNOSIS — F17210 Nicotine dependence, cigarettes, uncomplicated: Secondary | ICD-10-CM | POA: Diagnosis not present

## 2016-10-28 DIAGNOSIS — I272 Pulmonary hypertension, unspecified: Secondary | ICD-10-CM | POA: Diagnosis not present

## 2016-10-28 NOTE — Telephone Encounter (Signed)
Called [name] to discuss her carrier screening results. [name] had expanded carrier screening through [facility], which included carrier screening for the ACOG recommended conditions (SMA, CF, and hemoglobinopathies). The patient was identified by name and DOB. We reviewed that the results are negative for all of the conditions for which analysis was performed, with the exception of two conditions. We specifically discussed that carrier screening for cystic fibrosis was within normal limits. This anslysis indicates that she does not have a detectable gene alteration in any of the genes for which analysis was performed, with the exception of DHCR7 and BBS1. 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  We discussed these conditions briefly. Both conditions follow autosomal recessive inheritance.   She was counseled that Smith-Lemli-Opitz syndrome (SLOS) is a condition where impacts are due to impaired cholesterol synthesis, and effects can include birth defects, impaired growth, and intellectual disability. Prior to carrier screening for her husband, his risk to be a carrier for SLOS is estimated to be less than 1 in 500. Thus, prior to carrier screening for him, the risk for this condition in a pregnancy together is estimated to be less than 1 in 2000.   We briefly discussed that Bardet-Biedl syndrome, BBS1 related can have a range of features including vision problems, typically related to the retina, genital anomalies, polydactyly, obesity, and developmental disability. Prior to carrier screening for the patient's husband, his risk to be a carrier for BBS1 related conditions is approximately 1 in 160, meaning the risk for BBS1 related Bardet-Biedl syndrome in a pregnancy together is approximately 1 in 640.   We discussed the option of carrier screening for her partner. She was counseled that we can send a screening  kit to them or he can come to the office to have his blood drawn. She plans to discuss these options with her husband. The couple may contact us if they would like [place] to facilitate carrier screening for him.   We reviewed that carrier screening does not detect all carriers of all of these conditions, but a normal result significantly decreases the likelihood of being a carrier, and therefore, the overall reproductive risk. We reviewed that [facility] sequences most of the genes, which is associated with a high detection rate for carriers, thus a negative screen is very reassuring. All questions were answered to her satisfaction, she was encouraged to call with additional questions or concerns. ? Chipper Oman, MS Insurance risk surveyor

## 2016-10-28 NOTE — Telephone Encounter (Signed)
Patient returned call and stated that her husband, [name] would like to pursue carrier testing through [employer] for the two conditions that [name] was identified to be a carrier. They would like a [facility] saliva collection kit sent to them. We will facilitate this and will monitor for results and communicate this to the couple.   [name] [name] 10/28/2016 1:06 PM

## 2016-10-28 NOTE — Patient Instructions (Addendum)
I will discuss your case with Dr Lake Bells to decide whether you need a Right Heart Cath prior to considering pregnancy  In meantime really try hard to stop all smoking and lose wt to get as healthy as you can prior to conceiving  Add p Chart review:  Needs pfts and repeat echo with contrast then  consult Dr Lake Bells if abn

## 2016-10-28 NOTE — Progress Notes (Signed)
Subjective:    Patient ID: [name], female   DOB: 05/20/1985     MRN: [id number]    Brief patient profile:  31 yowf variably actively smoking  with lots of childhood sinus/ear infections req tubes/ adenoidectomy and allergy eval in [place] on daily inhalers since around 31 y old  for dx of asthma "outgrew the need for maint inhalers by age 31" and on allergy shots as well both as child and young adult but stopped early 31s and smoked from  age 31  > then  onset of   cp/palpitations since Aug 2016 > w/u mild PH, abn pfts so referred to pulmonary clinic 12/23/2014 by cards/ Tiffany /primary [facility]    History of Present Illness  12/23/2014 1st [facility] office visit/ Shyloh Krinke   Chief Complaint  Patient presents with  . Pulmonary Consult    Referred by Dr. Skeet Latch. Pt states she was dxed with mild pulmonary hypertension approx 3 wks ago. She states that she has been having CP, SOB and cough since August 2016.  She gets SOB walking up stairs and when exposed to cold air.   rarely needs rescue, some more x one week with smoke exp and assoc min dry cough. Cp is midline, worse with cough s dysphagia or overt hb or radiation n or v or diaphoresis - sob sometimes better with saba and sometimes not  rec Pantoprazole (protonix) 40 mg   Take  30-60 min before first meal of the day and Pepcid (famotidine)  20 mg one @  bedtime for at least a month  GERD diet   weight control is simply a matter of calorie balance Consider alternative to your present bcp's  Please schedule a follow up visit in 3 months but call sooner if needed if need the rescue more than twice weekly > did not return    10/28/2016  f/u ov/Holdyn Poyser re:  Re-establish re ? PH preventing pregnancy  Chief Complaint  Patient presents with  . Follow-up    Pt here to discuss risk of pregancy with her pulmonary hypertension. She states she just finished abx and pred taper for bronchitis. She has minimal non prod cough.    baseline no symptoms and able to run 5 miles s stopping 2.5 years prior to [place]  But since July 2018 more immobile dut to hip pain Planning to wait at least 3 months to get over the hip surgery [facility] concerned about PAS  25 on echo 10/22/16 but note this was tds   No obvious day to day or daytime variability or assoc excess/ purulent sputum or mucus plugs or hemoptysis or cp or chest tightness, subjective wheeze or overt sinus or hb symptoms. No unusual exp hx or h/o childhood pna/ asthma or knowledge of premature birth.  Sleeping ok flat without nocturnal  or early am exacerbation  of respiratory  c/o's or need for noct saba. Also denies any obvious fluctuation of symptoms with weather or environmental changes or other aggravating or alleviating factors except as outlined above   Current Allergies, Complete Past Medical History, Past Surgical History, Family History, and Social History were reviewed in [employer] record.  ROS  The following are not active complaints unless bolded Hoarseness, sore throat, dysphagia, dental problems, itching, sneezing,  nasal congestion or discharge of excess mucus or purulent secretions, ear ache,   fever, chills, sweats, unintended wt loss or wt gain, classically pleuritic or exertional cp,  orthopnea  pnd or leg swelling, presyncope, palpitations, abdominal pain, anorexia, nausea, vomiting, diarrhea  or change in bowel habits or change in bladder habits, change in stools or change in urine, dysuria, hematuria,  rash, arthralgias, visual complaints, headache, numbness, weakness or ataxia or problems with walking or coordination,  change in mood/affect or memory.        Current Meds  Medication Sig  . B Complex Vitamins (VITAMIN B COMPLEX PO) Take by mouth daily.  . Calcium-Magnesium-Zinc (CAL-MAG-ZINC PO) Take by mouth daily.  . Cholecalciferol (VITAMIN D3) 2000 units capsule Take 2,000 Units by mouth daily.  . diazepam (VALIUM)  5 MG tablet Take 5 mg by mouth.  Marland Kitchen HYDROcodone-acetaminophen (NORCO/VICODIN) 5-325 MG tablet Take 1 tablet by mouth every 6 (six) hours as needed for moderate pain.  . naproxen (NAPROSYN) 500 MG tablet Take 500 mg by mouth 2 (two) times daily.  . Omega-3 1000 MG CAPS Take 1 capsule by mouth daily.  . Prenatal Vit-Fe Fumarate-FA (PRENATAL VITAMIN PO) Take 1 tablet by mouth daily.  Marland Kitchen PROAIR RESPICLICK 314 (90 Base) MCG/ACT AEPB Inhale 2 puffs into the lungs every 4 (four) hours as needed.  . Probiotic Product (SOLUBLE FIBER/PROBIOTICS) CHEW Chew by mouth.                                 Objective:   Physical Exam   amb wf nad   10/28/2016      246   12/23/14 236 lb 12.8 oz (107.412 kg)  11/25/14 232 lb (105.235 kg)  11/06/14 233 lb (105.688 kg)    Vital signs reviewed - Note on arrival 02 sats  98% on RA     HEENT: nl dentition, turbinates, and oropharynx. Nl external ear canals without cough reflex   NECK :  without JVD/Nodes/TM/ nl carotid upstrokes bilaterally   LUNGS: no acc muscle use, clear to A and P bilaterally without cough on insp or exp maneuvers   CV:  RRR  no s3 or murmur or increase in P2, no edema   ABD:  soft and nontender with nl excursion in the supine position. No bruits or organomegaly, bowel sounds nl  MS:  warm without deformities, calf tenderness, cyanosis or clubbing  SKIN: warm and dry without lesions    NEURO:  alert, approp, no deficits         Assessment:

## 2016-10-31 DIAGNOSIS — F17210 Nicotine dependence, cigarettes, uncomplicated: Secondary | ICD-10-CM | POA: Insufficient documentation

## 2016-10-31 NOTE — Assessment & Plan Note (Signed)
>   3 min   Advised on risks of smoking on lung dz/ PA pressures and potential pregnancy issues and strongly advised to stop now

## 2016-10-31 NOTE — Assessment & Plan Note (Signed)
-   PFT's  12/02/2014  FEV1 3.31 (88 % ) ratio 69  p 10 % improvement from saba with DLCO  86 % corrects to 82 % for alv volume    She has bronchitis sev times a year more likely related to smoking than asthma but needs repeat pfts and advised re saba: If your breathing worsens or you need to use your rescue inhaler more than twice weekly or wake up more than twice a month with any respiratory symptoms or require more than two rescue inhalers per year, we need to see you right away because this means we're not controlling the underlying problem (inflammation) adequately.  Rescue inhalers (albuterol) do not control inflammation and overuse can lead to unnecessary and costly consequences.  They can make you feel better temporarily but eventually they will quit working effectively much as sleep aids lead to more insomnia if used regularly.

## 2016-10-31 NOTE — Assessment & Plan Note (Signed)
Body mass index is 35.3 kg/m.  -  trending up since immobilized for hip surgery  Lab Results  Component Value Date   TSH 2.460 08/31/2016     Contributing to gerd risk/ doe/reviewed the need and the process to achieve and maintain neg calorie balance > defer f/u primary care including intermittently monitoring thyroid status

## 2016-10-31 NOTE — Assessment & Plan Note (Addendum)
Echo 11/18/14 >    PAS 38 CTa > neg PE  PFTs 12/02/14 nl dlco  - Echo 10/22/16  PAS 25 but Inadequate for RWMA assessment. Recommend limited   study with definity contrast. - Repeat PFT's rec 10/28/2016    PH is unlikely if the PASP is ?36, the TRV is ?2.8, and there are no other suggestive findings  PH is confirmed when the mean pulmonary artery pressure is ?25 mmHg at rest.  A mean pulmonary artery pressure of 8 to 20 mmHg at rest is considered normal, while a mean pulmonary artery pressure of 21 to 24 mmHg at rest has uncertain clinical implications. Normal PAS ranges from 15 to 30 mmHg, diastolic pressure from 4 to 12 mmHg, and normal mPAP is ?20 mmHg.  So I doubt she has [place] in any form at this point but she does have Body mass index is 35.3 kg/m and actively smoking so does have increased baseline risk  rec no smoking/ repeat pfts/ wt loss/ repeat echo prior to stopping barrier methods and avoid BCPs   Discussed in detail all the  indications, usual  risks and alternatives  relative to the benefits with patient who agrees to proceed with w/u as outlined       I had an extended discussion with the patient reviewing all relevant studies completed to date and  lasting 25 minutes of a 40  minute office visit to re-establish and address PH  Plus non-specific but potentially very serious recurrent  respiratory symptoms of uncertain and potentially multiple  etiologies.   Each maintenance medication was reviewed in detail including most importantly the difference between maintenance and prns and under what circumstances the prns are to be triggered using an action plan format that is not reflected in the computer generated alphabetically organized AVS.    Please see AVS for specific instructions unique to this office visit that I personally wrote and verbalized to the the pt in detail and then reviewed with pt  by my nurse highlighting any changes in therapy/plan of care  recommended at today's  visit.

## 2016-11-02 ENCOUNTER — Other Ambulatory Visit: Payer: Self-pay

## 2016-11-09 ENCOUNTER — Telehealth: Payer: Self-pay | Admitting: Internal Medicine

## 2016-11-09 NOTE — Telephone Encounter (Signed)
Spoke with pt, who states she was calling for f/u on R heart cath. Pt states during her OV on 10/28/16, MW stated he would discuss pt's case further with Dr. Lake Bells and call her back.  MW please advise. Thanks.    10/28/16 AVS I will discuss your case with Dr Lake Bells to decide whether you need a Right Heart Cath prior to considering pregnancy  In meantime really try hard to stop all smoking and lose wt to get as healthy as you can prior to conceiving  Add p Chart review:  Needs pfts and repeat echo with contrast then  consult Dr Lake Bells if abn

## 2016-11-09 NOTE — Telephone Encounter (Signed)
There may have been a miscommunication but after chart review I realized the w/u is not to the point yet where decision is cath vs no cath as the echo was not done the way it needs to be for this decision and at this point as per instruction addendum:  Add p Chart review: Needs pfts and repeat echo with contrast then consult Dr Lake Bells if abn and he can see her with this data and make decision re need for cath

## 2016-11-10 NOTE — Telephone Encounter (Signed)
Spoke with patient. She is aware of MWs recs. Nothing else needed at time of call.  

## 2016-11-10 NOTE — Telephone Encounter (Signed)
ATC pt, no answer. Left message for pt to call back.  

## 2016-11-10 NOTE — Telephone Encounter (Signed)
Pt returned phone call,

## 2016-11-10 NOTE — Telephone Encounter (Signed)
Left message for patient to call back  

## 2016-11-10 NOTE — Telephone Encounter (Signed)
Pt returned phone call, pt contact # [phone] can call after 2:45pm

## 2016-11-11 ENCOUNTER — Telehealth (HOSPITAL_COMMUNITY): Payer: Self-pay | Admitting: MS"

## 2016-11-11 NOTE — Telephone Encounter (Signed)
Called [name] to discussed that I was unable to initiate sending [employer] carrier screening collection kit to patient's partner given that we do not have his insurance information on file. Discussed that patient can initiate request for kit to be sent in the email from [employer] to her containing her lab report, or she can provide [place] his medical insurance information. Patient plans to initiate request for sample collection kit through her email to [place]. Additionally, gave patient updated information regarding [place] ([email or web address]), and discussed that I recently learned of this organization. They have a designated genetic counselors with the foundation and also appear to have a program that can facilitate free genetic testing in some cases. [name] had no additional questions at this time.  [name] 11/11/2016 11:21 AM

## 2016-11-16 ENCOUNTER — Telehealth: Payer: Self-pay | Admitting: Internal Medicine

## 2016-11-16 NOTE — Telephone Encounter (Signed)
Spoke with [name] and advised her that I could not locate the form. She states Dr. Sherene Sires can write a letter on our letterhead and fax it to her at [phone]. She just needs it to say she is ok to have surgery, she is having DNC hysterotomy and polyp removal, performed in the office with mobile anesthesia service, IV sedation, and the procedure will last 30 minutes. MW please advise.

## 2016-11-16 NOTE — Telephone Encounter (Signed)
That is fine, clear for surgery

## 2016-11-17 ENCOUNTER — Encounter: Payer: Self-pay | Admitting: *Deleted

## 2016-11-17 NOTE — Telephone Encounter (Signed)
Letter done and faxed to the number provided attn [facility]

## 2016-11-18 ENCOUNTER — Telehealth: Payer: Self-pay | Admitting: Internal Medicine

## 2016-11-18 DIAGNOSIS — J4520 Mild intermittent asthma, uncomplicated: Secondary | ICD-10-CM

## 2016-11-18 DIAGNOSIS — I270 Primary pulmonary hypertension: Secondary | ICD-10-CM

## 2016-11-18 NOTE — Telephone Encounter (Signed)
Will forward this message to MW as this number left if the number directly to Dr. Murrell Redden

## 2016-11-18 NOTE — Telephone Encounter (Signed)
Spoke with Dr. Amado Nash and gave message from [facility]. I ordered tests and will call pt to schedule PFT in the next week.

## 2016-11-18 NOTE — Telephone Encounter (Signed)
Needs echo with contrast and pfts next available dx pulmonary hypertension

## 2016-11-19 NOTE — Telephone Encounter (Signed)
Sherry scheduled PFT for pt, nothing further is needed.

## 2016-11-24 ENCOUNTER — Other Ambulatory Visit (HOSPITAL_COMMUNITY): Payer: BC Managed Care – PPO

## 2016-11-25 ENCOUNTER — Ambulatory Visit (INDEPENDENT_AMBULATORY_CARE_PROVIDER_SITE_OTHER): Payer: BC Managed Care – PPO | Admitting: Internal Medicine

## 2016-11-25 DIAGNOSIS — J4520 Mild intermittent asthma, uncomplicated: Secondary | ICD-10-CM | POA: Diagnosis not present

## 2016-11-25 LAB — PULMONARY FUNCTION TEST
DL/VA % pred: 85 %
DL/VA: 4.57 ml/min/mmHg/L
DLCO cor % pred: 88 %
DLCO cor: 28.04 ml/min/mmHg
DLCO unc % pred: 88 %
DLCO unc: 27.96 ml/min/mmHg
FEF 25-75 Post: 2.84 L/sec
FEF 25-75 Pre: 1.63 L/sec
FEF2575-%Change-Post: 74 %
FEF2575-%Pred-Post: 76 %
FEF2575-%Pred-Pre: 44 %
FEV1-%Change-Post: 21 %
FEV1-%Pred-Post: 91 %
FEV1-%Pred-Pre: 75 %
FEV1-Post: 3.37 L
FEV1-Pre: 2.78 L
FEV1FVC-%Change-Post: 13 %
FEV1FVC-%Pred-Pre: 74 %
FEV6-%Change-Post: 8 %
FEV6-%Pred-Post: 107 %
FEV6-%Pred-Pre: 99 %
FEV6-Post: 4.73 L
FEV6-Pre: 4.36 L
FEV6FVC-%Change-Post: 1 %
FEV6FVC-%Pred-Post: 100 %
FEV6FVC-%Pred-Pre: 99 %
FVC-%Change-Post: 7 %
FVC-%Pred-Post: 107 %
FVC-%Pred-Pre: 99 %
FVC-Post: 4.77 L
FVC-Pre: 4.45 L
Post FEV1/FVC ratio: 71 %
Post FEV6/FVC ratio: 99 %
Pre FEV1/FVC ratio: 62 %
Pre FEV6/FVC Ratio: 98 %
RV % pred: 88 %
RV: 1.52 L
TLC % pred: 101 %
TLC: 5.94 L

## 2016-11-25 NOTE — Patient Instructions (Signed)
PFT done today. 

## 2016-11-30 ENCOUNTER — Other Ambulatory Visit: Payer: Self-pay

## 2016-11-30 ENCOUNTER — Ambulatory Visit (HOSPITAL_COMMUNITY): Payer: BC Managed Care – PPO | Attending: Cardiology

## 2016-11-30 DIAGNOSIS — I270 Primary pulmonary hypertension: Secondary | ICD-10-CM | POA: Diagnosis not present

## 2016-11-30 DIAGNOSIS — I272 Pulmonary hypertension, unspecified: Secondary | ICD-10-CM | POA: Diagnosis present

## 2016-11-30 DIAGNOSIS — I071 Rheumatic tricuspid insufficiency: Secondary | ICD-10-CM | POA: Insufficient documentation

## 2016-11-30 DIAGNOSIS — I420 Dilated cardiomyopathy: Secondary | ICD-10-CM | POA: Insufficient documentation

## 2016-11-30 MED ORDER — PERFLUTREN LIPID MICROSPHERE
1.0000 mL | INTRAVENOUS | Status: AC | PRN
  Administered 2016-11-30: 2 mL via INTRAVENOUS

## 2016-11-30 NOTE — Progress Notes (Unsigned)
james

## 2016-12-01 ENCOUNTER — Telehealth: Payer: Self-pay | Admitting: Internal Medicine

## 2016-12-01 NOTE — Progress Notes (Signed)
LMTCB

## 2016-12-01 NOTE — Telephone Encounter (Signed)
Notes recorded by Nyoka Cowden, MD on 12/01/2016 at 5:28 AM EDT Call patient : Study is unremarkable, no contraindication to surgery or pregnancy   Advised pt of results. Pt understood and nothing further is needed.   Tried to call [facility] and was on hold for over 6 minutes. Will try again later.

## 2016-12-02 NOTE — Progress Notes (Signed)
See phone note dated

## 2016-12-02 NOTE — Telephone Encounter (Signed)
lmomtcb x 1 at obgyn to make them aware of MW recs.

## 2016-12-02 NOTE — Progress Notes (Signed)
See phone note dated 12/01/16

## 2016-12-02 NOTE — Telephone Encounter (Signed)
Spoke with [facility] and gave recs from MW. They would like a letter on letterhead clearing pt for surgery.  Letter needs to be faxed to the number below. Done and nothing further is needed.   [phone]

## 2016-12-24 ENCOUNTER — Telehealth: Payer: Self-pay | Admitting: Internal Medicine

## 2016-12-24 NOTE — Telephone Encounter (Signed)
[name]. faxed over copy of last x-ray with Dr. Gustavus Bryant signature.  Nothing further needed per [facility]

## 2017-01-01 HISTORY — PX: DILATION AND CURETTAGE OF UTERUS: SHX78

## 2017-02-14 ENCOUNTER — Encounter: Payer: Self-pay | Admitting: Dietician

## 2017-02-14 ENCOUNTER — Encounter: Payer: BC Managed Care – PPO | Attending: Gastroenterology | Admitting: Dietician

## 2017-02-14 DIAGNOSIS — K900 Celiac disease: Secondary | ICD-10-CM

## 2017-02-14 DIAGNOSIS — E669 Obesity, unspecified: Secondary | ICD-10-CM

## 2017-02-14 DIAGNOSIS — Z713 Dietary counseling and surveillance: Secondary | ICD-10-CM | POA: Diagnosis not present

## 2017-02-14 NOTE — Patient Instructions (Signed)
Continue to aim to be active most days. Balanced meals with carbohydrate and protein. Small amounts of protein with each snack (hummus, nuts) Plan a small snack each afternoon if hungry or you tend to be nauseous.  Vitamin D3- 2000 units daily. Try the prenatal vitamin after your largest meal. Be sure you are drinking enough water and stay hydrated. Increase your plant intake.  [email or web address] for allergy evaluation.  Raphael Gibney, RD, PhD  [phone]

## 2017-02-14 NOTE — Progress Notes (Signed)
  Medical Nutrition Therapy:  Appt start time: 1545 end time:  1645.   Assessment:  Primary concerns today: Patient is here today with her husband.  She has a history of celiac and has been having problems with nausea and concerns about her blood sugar being too low although she has not checked this.  She has been dizzy.  She is concerned that she is getting gluten somewhere.  She also was found to have diverticulosis.    Weight 240 lbs. Stable.  She has been told that they need to lose weight prior to becoming pregnant.  Patient lives with her husband.  Patient does most of the shopping and cooking.  She is a Electrical engineer.   Preferred Learning Style:   No preference indicated   Learning Readiness:   Ready  Change in progress   MEDICATIONS: see list to include prenatal vitamin   DIETARY INTAKE: Eats out once per week.  24-hr recall:  B ( AM): GF oats, banana OR GF bagel with avocado    OR eggs and chicken sausage  Snk ( AM): none  L ( PM): leftovers from dinner Snk ( PM):  Fruit or beef stick, Late July chips D ( PM): chicken, vegetable Snk ( PM): occasionall Beverages: water, coffee with natural bliss creamer  Usual physical activity: PT once per week for hip and recent stretching and cardiovascular exercises twice per week for 30 minutes each.  Estimated energy needs: 1700 calories 100 g protein  Progress Towards Goal(s):  In progress.   Nutritional Diagnosis:  NB-1.1 Food and nutrition-related knowledge deficit As related to general nutrition and gluten free diet.  As evidenced by patient report and diet hx.    Intervention:  Nutrition education related to gluten free diet and contamination.  Discussed mindful eating, importance of staying active, tips of timing of prenatal vitamin to avoid nausea, importance of hydration as this can make her dizzy as well.  Discussed balance meals (balance of protein and carbohydrates to prevent blood sugar issues) and tips to  increase variety.  She had questions related to food allergy testing as well as feels she is having problems with eggs (diarrhea) and dairy (nausea).  Discussed keeping a food log or option of additional testing.  Continue to aim to be active most days. Balanced meals with carbohydrate and protein. Small amounts of protein with each snack (hummus, nuts) Plan a small snack each afternoon if hungry or you tend to be nauseous.  Vitamin D3- 2000 units daily. Try the prenatal vitamin after your largest meal. Be sure you are drinking enough water and stay hydrated. Increase your plant intake.  [email or web address] Consider [facility] for allergy evaluation.  Raphael Gibney, RD, PhD  [phone]    Teaching Method Utilized:  Visual Auditory Hands on  Handouts given during visit include:  Meal plan card  Snack list  And label reading  Barriers to learning/adherence to lifestyle change: food intolerances  Demonstrated degree of understanding via:  Teach Back   Monitoring/Evaluation:  Dietary intake, exercise, and body weight prn.

## 2017-03-06 ENCOUNTER — Other Ambulatory Visit: Payer: Self-pay

## 2017-03-06 ENCOUNTER — Ambulatory Visit (HOSPITAL_COMMUNITY)
Admission: EM | Admit: 2017-03-06 | Discharge: 2017-03-06 | Disposition: A | Discharge status: Home / Self Care | Payer: BC Managed Care – PPO | Attending: Family Medicine | Admitting: Family Medicine

## 2017-03-06 ENCOUNTER — Encounter (HOSPITAL_COMMUNITY): Payer: Self-pay | Admitting: Family Medicine

## 2017-03-06 DIAGNOSIS — J0100 Acute maxillary sinusitis, unspecified: Secondary | ICD-10-CM

## 2017-03-06 MED ORDER — FLUTICASONE PROPIONATE 50 MCG/ACT NA SUSP: 2.0000 | Freq: Two times a day (BID) | NASAL | 12 refills | Status: DC

## 2017-03-06 NOTE — ED Provider Notes (Signed)
[facility]   [id number] 03/06/17 Arrival Time: 1927   SUBJECTIVE:  [name] is a 32 y.o. female who presents to the urgent care with complaint of sinus infection, ear infection, nausea, dizzy, stomach cramps.  She saw her primary care doctor who diagnosed a left ear infection on Thursday and started her on doxycycline.  She had cramps and nausea ever since.  School teacher  Past Medical History:  Diagnosis Date  . Asthma   . Celiac disease   . Hypertension    Family History  Problem Relation Age of Onset  . Transient ischemic attack Mother 62  . Hyperlipidemia Mother 53  . Mitral valve prolapse Mother   . Thyroid disease Mother   . Asthma Mother   . Healthy Father   . Diabetes Maternal Grandmother   . Hyperlipidemia Maternal Grandmother   . Kidney disease Maternal Grandmother   . Alzheimer's disease Maternal Grandmother   . Skin cancer Paternal Grandfather   . Heart attack Maternal Grandfather 63  . Emphysema Maternal Grandfather        smoked  . Alpha-1 antitrypsin deficiency Maternal Grandfather   . Other Paternal Grandmother        neuroendocrine cancer  . Skin cancer Paternal Grandmother   . Hypertension Paternal Grandmother   . Healthy Brother   . Thyroid disease Sister   . Cystic fibrosis Sister        carrier   Social History   Socioeconomic History  . Marital status: Married    Spouse name: Not on file  . Number of children: Not on file  . Years of education: Not on file  . Highest education level: Not on file  Social Needs  . Financial resource strain: Not on file  . Food insecurity - worry: Not on file  . Food insecurity - inability: Not on file  . Transportation needs - medical: Not on file  . Transportation needs - non-medical: Not on file  Occupational History  . Occupation: Runner, broadcasting/film/video  Tobacco Use  . Smoking status: Current Every Day Smoker    Packs/day: 0.25    Years: 16.00    Pack years: 4.00    Types: Cigarettes  .  Smokeless tobacco: Never Used  Substance and Sexual Activity  . Alcohol use: Yes    Alcohol/week: 0.0 oz    Comment: Occas.  . Drug use: No  . Sexual activity: Not on file  Other Topics Concern  . Not on file  Social History Narrative  . Not on file   Current Meds  Medication Sig  . B Complex Vitamins (VITAMIN B COMPLEX PO) Take by mouth daily.  . Calcium-Magnesium-Zinc (CAL-MAG-ZINC PO) Take by mouth daily.  . Cholecalciferol (VITAMIN D3) 2000 units capsule Take 2,000 Units by mouth daily.  . citalopram (CELEXA) 20 MG tablet Take 20 mg by mouth daily.  . famotidine (PEPCID) 20 MG tablet Take 20 mg by mouth 2 (two) times daily.  . Omega-3 1000 MG CAPS Take 1 capsule by mouth daily.  . Prenatal Vit-Fe Fumarate-FA (PRENATAL VITAMIN PO) Take 1 tablet by mouth daily.  Marland Kitchen PROAIR RESPICLICK 108 (90 Base) MCG/ACT AEPB Inhale 2 puffs into the lungs every 4 (four) hours as needed.  . Probiotic Product (SOLUBLE FIBER/PROBIOTICS) CHEW Chew by mouth.   Allergies  Allergen Reactions  . Avelox [Moxifloxacin] Shortness Of Breath  . Cephalosporins Shortness Of Breath    CP  . Food Anaphylaxis    "pine nuts"  . Penicillins  Anaphylaxis  . Sulfa Antibiotics Shortness Of Breath  . Biaxin [Clarithromycin] Nausea And Vomiting      ROS: As per HPI, remainder of ROS negative.   OBJECTIVE:   Vitals:   03/06/17 2007  BP: 120/78  Pulse: (!) 110  Resp: 18  Temp: 97.8 F (36.6 C)  TempSrc: Oral  SpO2: 100%     General appearance: alert; no distress Eyes: PERRL; EOMI; conjunctiva normal HENT: normocephalic; atraumatic; TMs normal, canal normal, external ears normal without trauma; nasal mucosa normal; oral mucosa normal Neck: supple Lungs: clear to auscultation bilaterally Heart: regular rate and rhythm Back: no CVA tenderness Extremities: no cyanosis or edema; symmetrical with no gross deformities Skin: warm and dry Neurologic: normal gait; grossly normal Psychological: alert and  cooperative; normal mood and affect      Labs:  Results for orders placed or performed in visit on 11/25/16  Pulmonary function test  Result Value Ref Range   FVC-Pre 4.45 L   FVC-%Pred-Pre 99 %   FVC-Post 4.77 L   FVC-%Pred-Post 107 %   FVC-%Change-Post 7 %   FEV1-Pre 2.78 L   FEV1-%Pred-Pre 75 %   FEV1-Post 3.37 L   FEV1-%Pred-Post 91 %   FEV1-%Change-Post 21 %   FEV6-Pre 4.36 L   FEV6-%Pred-Pre 99 %   FEV6-Post 4.73 L   FEV6-%Pred-Post 107 %   FEV6-%Change-Post 8 %   Pre FEV1/FVC ratio 62 %   FEV1FVC-%Pred-Pre 74 %   Post FEV1/FVC ratio 71 %   FEV1FVC-%Change-Post 13 %   Pre FEV6/FVC Ratio 98 %   FEV6FVC-%Pred-Pre 99 %   Post FEV6/FVC ratio 99 %   FEV6FVC-%Pred-Post 100 %   FEV6FVC-%Change-Post 1 %   FEF 25-75 Pre 1.63 L/sec   FEF2575-%Pred-Pre 44 %   FEF 25-75 Post 2.84 L/sec   FEF2575-%Pred-Post 76 %   FEF2575-%Change-Post 74 %   RV 1.52 L   RV % pred 88 %   TLC 5.94 L   TLC % pred 101 %   DLCO unc 27.96 ml/min/mmHg   DLCO unc % pred 88 %   DLCO cor 28.04 ml/min/mmHg   DLCO cor % pred 88 %   DL/VA 2.44 ml/min/mmHg/L   DL/VA % pred 85 %    Labs Reviewed - No data to display  No results found.     ASSESSMENT & PLAN:  1. Acute maxillary sinusitis, recurrence not specified     Meds ordered this encounter  Medications  . fluticasone (FLONASE) 50 MCG/ACT nasal spray    Sig: Place 2 sprays into both nostrils 2 (two) times daily.    Dispense:  16 g    Refill:  12    Reviewed expectations re: course of current medical issues. Questions answered. Outlined signs and symptoms indicating need for more acute intervention. Patient verbalized understanding. After Visit Summary given.    Procedures:  Stop the doxycycline Continue the [place] and probiotic      Elvina Sidle, MD 03/06/17 2018

## 2017-03-06 NOTE — Discharge Instructions (Signed)
Stop the doxycycline Continue the Bentyl and probiotic

## 2017-05-09 ENCOUNTER — Encounter: Payer: Self-pay | Admitting: Obstetrics & Gynecology

## 2017-05-09 ENCOUNTER — Ambulatory Visit: Payer: BC Managed Care – PPO | Admitting: Obstetrics & Gynecology

## 2017-05-09 VITALS — BP 126/84 | Ht 69.75 in | Wt 235.0 lb

## 2017-05-09 DIAGNOSIS — F329 Major depressive disorder, single episode, unspecified: Secondary | ICD-10-CM | POA: Diagnosis not present

## 2017-05-09 DIAGNOSIS — N921 Excessive and frequent menstruation with irregular cycle: Secondary | ICD-10-CM | POA: Diagnosis not present

## 2017-05-09 DIAGNOSIS — F419 Anxiety disorder, unspecified: Secondary | ICD-10-CM

## 2017-05-09 DIAGNOSIS — Z3169 Encounter for other general counseling and advice on procreation: Secondary | ICD-10-CM

## 2017-05-09 DIAGNOSIS — F32A Depression, unspecified: Secondary | ICD-10-CM

## 2017-05-09 MED ORDER — SERTRALINE HCL 50 MG PO TABS: 50.0000 mg | ORAL_TABLET | Freq: Every day | ORAL | 5 refills | Status: DC

## 2017-05-09 NOTE — Progress Notes (Signed)
    [name] Nov 14, 1985 [id number]        32 y.o.  G0 Married  RP: Longstanding Irregular menses  HPI:  Continued frequent metrorrhagia and post coital spotting after a [facility] Excision of an Endometrial Polyp 12/2016.  Menses regular every 3-4 wks, normal flow.  Feeling her ovulation at mid cycle.  No pelvic pain otherwise.  Normal vaginal secretions.  Urine/BMs wnl.  Breasts wnl.  BMI 33.96.  Would like to start attempting conception.  Anxiety/Depression on Celexa.  Per patient, wrongly diagnosed with Pulmonary HTN.  Evaluated by Pneumologist Fall 2018, normal lungs, no Pulmonary HTN.  Recent Cardiac Echo normal.  Strong Fam h/o Cardiac disease.  Sister with CF.  Seen by Genetics, not a CF carrier, genetic testing neg.  Annual Gyn exam 02/2017, Pap negative/HPV HR neg.  TSH 02/2017 normal at 2.69.   OB History  Gravida Para Term Preterm AB Living  0 0 0 0 0 0  SAB TAB Ectopic Multiple Live Births  0 0 0 0 0    Past medical history,surgical history, problem list, medications, allergies, family history and social history were all reviewed and documented in the EPIC chart.   Directed ROS with pertinent positives and negatives documented in the history of present illness/assessment and plan.  Exam:  Vitals:   05/09/17 1435  BP: 126/84  Weight: 235 lb (106.6 kg)  Height: 5' 9.75" (1.772 m)   General appearance:  Normal  Abdomen: Normal  Gynecologic exam: Vulva normal.  Speculum:  Cervix/Vagina normal.  Normal secretions.  Bimanual exam: Uterus anteverted, normal volume, mobile, nontender.  No adnexal mass felt, nontender.   Assessment/Plan:  32 y.o. G0P0000   1. Metrorrhagia Persistent metrorrhagia.  History of endometrial polyp resected in November 2018.  Patient will follow up for pelvic ultrasound to rule out a recurrence of polyps or other endometrial pathology. - US Transvaginal Non-OB; Future  2. Anxiety and depression No symptoms of major depression currently.  No  suicidal ideation.  Patient is planning to attempt conception.  Therefore switched to sertraline 50 mg per mouth daily.  3. Pre-conception counseling Recommend to start on prenatal vitamins.  Healthy nutrition and regular physical activity.  Other orders - sertraline (ZOLOFT) 50 MG tablet; Take 1 tablet (50 mg total) by mouth daily.  Counseling on above issues and coordination of care more than 50% of 25 minutes. Genia Del MD, 2:43 PM 05/09/2017

## 2017-05-15 ENCOUNTER — Encounter: Payer: Self-pay | Admitting: Obstetrics & Gynecology

## 2017-05-15 NOTE — Patient Instructions (Signed)
1. Metrorrhagia Persistent metrorrhagia.  History of endometrial polyp resected in November 2018.  Patient will follow up for pelvic ultrasound to rule out a recurrence of polyps or other endometrial pathology. - US Transvaginal Non-OB; Future  2. Anxiety and depression No symptoms of major depression currently.  No suicidal ideation.  Patient is planning to attempt conception.  Therefore switched to sertraline 50 mg per mouth daily.  3. Pre-conception counseling Recommend to start on prenatal vitamins.  Healthy nutrition and regular physical activity.  Other orders - sertraline (ZOLOFT) 50 MG tablet; Take 1 tablet (50 mg total) by mouth daily.  Pura, it was a pleasure seeing you today!  I will see you again soon for the pelvic ultrasound.

## 2017-05-16 ENCOUNTER — Ambulatory Visit (INDEPENDENT_AMBULATORY_CARE_PROVIDER_SITE_OTHER): Payer: BC Managed Care – PPO

## 2017-05-16 ENCOUNTER — Encounter: Payer: Self-pay | Admitting: Obstetrics & Gynecology

## 2017-05-16 ENCOUNTER — Ambulatory Visit: Payer: BC Managed Care – PPO | Admitting: Obstetrics & Gynecology

## 2017-05-16 VITALS — BP 118/76

## 2017-05-16 DIAGNOSIS — N921 Excessive and frequent menstruation with irregular cycle: Secondary | ICD-10-CM | POA: Diagnosis not present

## 2017-05-16 DIAGNOSIS — N889 Noninflammatory disorder of cervix uteri, unspecified: Secondary | ICD-10-CM | POA: Diagnosis not present

## 2017-05-16 NOTE — Patient Instructions (Signed)
1. Metrorrhagia No evidence of endometrial lesion on pelvic ultrasound today.  Possible endocervical lesion, polyp?  Could have metrorrhagia and postcoital bleeding due to ectropion.  Pap test negative with negative high risk HPV in January 2019.  Pending endocervical curettage pathology.  Will follow up for a pelvic ultrasound in 3 months to reassess. - Pathology Report - US Transvaginal Non-OB; Future  2. Cervical lesion Possible endocervical lesion per pelvic ultrasound today with positive color flow Doppler.  Endocervical polyp?  Endocervical curettage done today.  Follow-up in 3 months for repeat pelvic ultrasound.  If continues to have metrorrhagia/postcoital bleeding and the cervical area of fullness persist, will proceed with hysteroscopy at that point.  - Pathology Report - US Transvaginal Non-OB; Future  [name], good seeing you today!  I will inform you of your results as soon as they are available.

## 2017-05-16 NOTE — Progress Notes (Signed)
    [name] 11/15/1985 [id number]        32 y.o.  G0 Married  RP: Metrorrhagia for Pelvic US  HPI: Menstrual periods every 3-4 weeks with normal flow.  Continued frequent metrorrhagia and postcoital bleeding.  H/O Endometrial polyp removed by [facility] 12/2016.  Pap neg/HPV HR neg 02/2017.  No contraception, would like to attempt conception.   OB History  Gravida Para Term Preterm AB Living  0 0 0 0 0 0  SAB TAB Ectopic Multiple Live Births  0 0 0 0 0    Past medical history,surgical history, problem list, medications, allergies, family history and social history were all reviewed and documented in the EPIC chart.   Directed ROS with pertinent positives and negatives documented in the history of present illness/assessment and plan.  Exam:  Vitals:   05/16/17 1033  BP: 118/76   General appearance:  Normal  Pelvic US today: T/V images.  Uterus anteverted, generous measuring 7.17 x 4.61 x 3.42 cm.  Endometrial lining is normal tri-layered measuring 7.6 mm.  Fullness noted in the center of the cervical area with positive color flow Doppler.  Unable to measure a focal area.  Right and left ovaries normal.  No apparent mass in the right or left adnexa.  No free fluid in the posterior cul-de-sac.  Gynecologic exam: Vulva normal.  Cervix is relatively large with a large ectropion.  No cervical polyp visible at the exocervix.  No current bleeding.  Vagina normal.  Normal vaginal secretions.  Endocervical curettage done.  [place] clamp also used in an attempt to grasp a possible endocervical polyp.  No tissue recovered with the [facility] clamp.  Specimen sent to pathology.  Instruments removed.  Procedure well-tolerated by the patient.   Assessment/Plan:  32 y.o. G0P0000   1. Metrorrhagia No evidence of endometrial lesion on pelvic ultrasound today.  Possible endocervical lesion, polyp?  Could have metrorrhagia and postcoital bleeding due to ectropion.  Pap test negative with negative high  risk HPV in January 2019.  Pending endocervical curettage pathology.  Will follow up for a pelvic ultrasound in 3 months to reassess. - Pathology Report - US Transvaginal Non-OB; Future  2. Cervical lesion Possible endocervical lesion per pelvic ultrasound today with positive color flow Doppler.  Endocervical polyp?  Endocervical curettage done today.  Follow-up in 3 months for repeat pelvic ultrasound.  If continues to have metrorrhagia/postcoital bleeding and the cervical area of fullness persist, will proceed with hysteroscopy at that point.  - Pathology Report - US Transvaginal Non-OB; Future  Counseling on above issues and coordination of care more than 50% of 15 minutes.  Genia Del MD, 10:47 AM 05/16/2017

## 2017-05-18 LAB — TISSUE SPECIMEN

## 2017-05-18 LAB — PATHOLOGY

## 2017-07-27 ENCOUNTER — Other Ambulatory Visit: Payer: Self-pay

## 2017-07-27 ENCOUNTER — Encounter (HOSPITAL_COMMUNITY): Payer: Self-pay

## 2017-07-27 ENCOUNTER — Emergency Department (HOSPITAL_COMMUNITY)
Admission: EM | Admit: 2017-07-27 | Discharge: 2017-07-27 | Disposition: A | Discharge status: Home / Self Care | Payer: BC Managed Care – PPO | Attending: Emergency Medicine | Admitting: Emergency Medicine

## 2017-07-27 DIAGNOSIS — J45909 Unspecified asthma, uncomplicated: Secondary | ICD-10-CM | POA: Diagnosis not present

## 2017-07-27 DIAGNOSIS — T63461A Toxic effect of venom of wasps, accidental (unintentional), initial encounter: Secondary | ICD-10-CM | POA: Diagnosis not present

## 2017-07-27 DIAGNOSIS — F17210 Nicotine dependence, cigarettes, uncomplicated: Secondary | ICD-10-CM | POA: Diagnosis not present

## 2017-07-27 DIAGNOSIS — Z79899 Other long term (current) drug therapy: Secondary | ICD-10-CM | POA: Diagnosis not present

## 2017-07-27 DIAGNOSIS — Z91038 Other insect allergy status: Secondary | ICD-10-CM

## 2017-07-27 DIAGNOSIS — I10 Essential (primary) hypertension: Secondary | ICD-10-CM | POA: Insufficient documentation

## 2017-07-27 MED ORDER — PREDNISONE 10 MG PO TABS: 10.0000 mg | ORAL_TABLET | Freq: Three times a day (TID) | ORAL | 0 refills | Status: AC

## 2017-07-27 MED ORDER — EPINEPHRINE 0.3 MG/0.3ML IJ SOAJ
0.3000 mg | Freq: Once | INTRAMUSCULAR | Status: AC
  Administered 2017-07-27: 0.3 mg via INTRAMUSCULAR
  Filled 2017-07-27: qty 0.3

## 2017-07-27 MED ORDER — FAMOTIDINE 20 MG PO TABS
20.0000 mg | ORAL_TABLET | Freq: Once | ORAL | Status: AC
  Administered 2017-07-27: 20 mg via ORAL
  Filled 2017-07-27: qty 1

## 2017-07-27 NOTE — ED Notes (Signed)
Pt ambulating to restroom without difficulty.   

## 2017-07-27 NOTE — ED Notes (Signed)
Returned from restroom.

## 2017-07-27 NOTE — Discharge Instructions (Addendum)
Please read and follow all provided instructions.  Your diagnoses today include:  1. Allergic reaction to insect bite     Tests performed today include: Vital signs. See below for your results today.   Medications prescribed:   Take any prescribed medications only as directed.  Please take prednisone, 30 mg for the next 2 days.  You may also take Benadryl, 25 mg every 6 hours as needed for itching.  Please also resume your normal Pepcid dosing.  Home care instructions:  Follow any educational materials contained in this packet  Follow-up instructions: Please follow-up with your primary care provider in the next 3 days for further evaluation of your symptoms.   Return instructions:  Please return to the Emergency Department if you experience worsening symptoms.  Call 9-1-1 immediately if you have an allergic reaction that involves your lips, mouth, throat or if you have any difficulty breathing. This is a life-threatening emergency.  Please return if you have any other emergent concerns.  Additional Information:  Your vital signs today were: BP 108/70    Pulse (!) 58    Temp 97.9 F (36.6 C) (Oral)    Resp 14    Ht 5\' 10"  (1.778 m)    Wt 108.9 kg (240 lb)    LMP 07/26/2017    SpO2 96%    BMI 34.44 kg/m  If your blood pressure (BP) was elevated above 130/80 this visit, please have this repeated by your doctor within one month. --------------  Thank you for allowing [place] to participate in your care today!

## 2017-07-27 NOTE — ED Triage Notes (Signed)
Pt also took benadryl 37 mg

## 2017-07-27 NOTE — ED Notes (Signed)
Pt reports improvement in chest and throat tightness, denies any distress at this time

## 2017-07-27 NOTE — ED Triage Notes (Signed)
Pt reports that she was stung by a wasp today around 845, went to her PCP and was given Solumedrol. States that since she has been coughing and feeling tight in her chest. Airway in tact in triage, speaking in full sentences. No coughing at this time.

## 2017-07-27 NOTE — ED Provider Notes (Signed)
[name] EMERGENCY DEPARTMENT Provider Note   CSN: [id number] Arrival date & time: 07/27/17  1043     History   Chief Complaint Chief Complaint  Patient presents with  . Insect Bite    HPI [name] is a 32 y.o. female.  HPI  Patient is a 32 year old female with a history of asthma, celiac disease, and hypertension presenting for pruritus, chest burning, and coughing after being stung by a wasp.  Patient has a history of a wasp bite requiring EpiPen administration.  Patient reports that she was stung by wasp at approximately 8:30 AM this morning, and presented to her primary care provider.  At that time, patient reports she is experiencing coughing, sensation of wheezing, and general pruritus.  Patient also had nausea without vomiting or abdominal cramping.  Patient reports that she subsequently had recurrence of the pruritus and a sensation of chest burning.  Patient was concerned that she was having a recurrence of her anaphylactic symptoms.  Patient did not have a refill of her EpiPen at the time.  Past Medical History:  Diagnosis Date  . Asthma   . Celiac disease   . Hypertension     Patient Active Problem List   Diagnosis Date Noted  . Cigarette smoker 10/31/2016  . Family history of genetic disorder 10/15/2016  . Mild persistent chronic asthma without complication 81/27/5170  . Pulmonary hypertension ([place]) 12/23/2014  . Obesity (BMI 30-39.9) 12/23/2014  . GERD (gastroesophageal reflux disease) 12/23/2014    Past Surgical History:  Procedure Laterality Date  . ADENOIDECTOMY    . DILATION AND CURETTAGE OF UTERUS  01/2017   polyp removed   . FOOT SURGERY Left   . HIP SURGERY    . TONSILLECTOMY       OB History    Gravida  0   Para  0   Term  0   Preterm  0   AB  0   Living  0     SAB  0   TAB  0   Ectopic  0   Multiple  0   Live Births  0            Home Medications    Prior to Admission medications     Medication Sig Start Date End Date Taking? Authorizing Provider  Calcium-Magnesium-Zinc (CAL-MAG-ZINC PO) Take by mouth daily.    [provider]  Cholecalciferol (VITAMIN D3) 2000 units capsule Take 2,000 Units by mouth daily.    [provider]  citalopram (CELEXA) 20 MG tablet Take 20 mg by mouth daily.    [provider]  famotidine (PEPCID) 20 MG tablet Take 20 mg by mouth 2 (two) times daily.    [provider]  loratadine (CLARITIN) 10 MG tablet Take 10 mg by mouth daily.    [provider]  Omega-3 1000 MG CAPS Take 1 capsule by mouth daily.    [provider]  Prenatal Vit-Fe Fumarate-FA (PRENATAL VITAMIN PO) Take 1 tablet by mouth daily.    [provider]  PROAIR RESPICLICK 017 (90 Base) MCG/ACT AEPB Inhale 2 puffs into the lungs every 4 (four) hours as needed. 10/15/16   [provider]  Probiotic Product (SOLUBLE FIBER/PROBIOTICS) CHEW Chew by mouth.    [provider]  sertraline (ZOLOFT) 50 MG tablet Take 1 tablet (50 mg total) by mouth daily. 05/09/17   Princess Bruins, MD    Family History Family History  Problem Relation Age  of Onset  . Transient ischemic attack Mother 43  . Hyperlipidemia Mother 51  . Mitral valve prolapse Mother   . Thyroid disease Mother   . Asthma Mother   . Healthy Father   . Diabetes Maternal Grandmother   . Hyperlipidemia Maternal Grandmother   . Kidney disease Maternal Grandmother   . Alzheimer's disease Maternal Grandmother   . Skin cancer Paternal Grandfather   . Heart attack Maternal Grandfather 63  . Emphysema Maternal Grandfather        smoked  . Alpha-1 antitrypsin deficiency Maternal Grandfather   . Other Paternal Grandmother        neuroendocrine cancer  . Skin cancer Paternal Grandmother   . Hypertension Paternal Grandmother   . Healthy Brother   . Thyroid disease Sister   . Cystic fibrosis Sister        carrier    Social History Social  History   Tobacco Use  . Smoking status: Current Every Day Smoker    Packs/day: 0.25    Years: 16.00    Pack years: 4.00    Types: Cigarettes  . Smokeless tobacco: Never Used  Substance Use Topics  . Alcohol use: Yes    Alcohol/week: 0.0 oz    Comment: 2 glasses a week  . Drug use: No     Allergies   Avelox [moxifloxacin]; Cephalosporins; Food; Penicillins; Sulfa antibiotics; and Biaxin [clarithromycin]   Review of Systems Review of Systems  HENT: Negative for congestion, sneezing, sore throat, trouble swallowing and voice change.   Respiratory: Positive for cough and wheezing. Negative for stridor.   Gastrointestinal: Positive for nausea.  Skin: Positive for rash.  All other systems reviewed and are negative.    Physical Exam Updated Vital Signs BP 111/79 (BP Location: Right Arm)   Pulse 71   Temp 97.9 F (36.6 C) (Oral)   Resp 18   Ht 5' 10"  (1.778 m)   Wt 108.9 kg (240 lb)   LMP 07/26/2017   SpO2 99%   BMI 34.44 kg/m   Physical Exam  Constitutional: She appears well-developed and well-nourished. No distress.  HENT:  Head: Normocephalic and atraumatic.  Mouth/Throat: Oropharynx is clear and moist.  Eyes: Pupils are equal, round, and reactive to light. Conjunctivae and EOM are normal.  Neck: Normal range of motion. Neck supple.  Cardiovascular: Normal rate, regular rhythm, S1 normal and S2 normal.  No murmur heard. Pulmonary/Chest: Effort normal and breath sounds normal. She has no wheezes. She has no rales.  Abdominal: Soft. She exhibits no distension. There is no tenderness. There is no guarding.  Musculoskeletal: Normal range of motion. She exhibits no edema or deformity.  Neurological: She is alert.  Cranial nerves grossly intact. Patient moves extremities symmetrically and with good coordination.  Skin: Skin is warm and dry. Rash noted. There is erythema.  Site of the insect sting demonstrates a raised, erythematous wheal approximately 10 cm x 10 cm  in diameter around the bite area. Small wheals noted on back.  Psychiatric: She has a normal mood and affect. Her behavior is normal. Judgment and thought content normal.  Nursing note and vitals reviewed.    ED Treatments / Results  Labs (all labs ordered are listed, but only abnormal results are displayed) Labs Reviewed - No data to display  EKG EKG Interpretation  Date/Time:  Wednesday July 27 2017 10:53:07 EDT Ventricular Rate:  72 PR Interval:  132 QRS Duration: 82 QT Interval:  398 QTC Calculation: 435 R Axis:  77 Text Interpretation:  Normal sinus rhythm Normal ECG No old tracing to compare Confirmed by Daleen Bo [phone]) on 07/27/2017 11:38:45 AM   Radiology No results found.  Procedures Procedures (including critical care time)  Medications Ordered in ED Medications  EPINEPHrine (EPI-PEN) injection 0.3 mg (0.3 mg Intramuscular Given 07/27/17 1118)  famotidine (PEPCID) tablet 20 mg (20 mg Oral Given 07/27/17 1119)     Initial Impression / Assessment and Plan / ED Course  I have reviewed the triage vital signs and the nursing notes.  Pertinent labs & imaging results that were available during my care of the patient were reviewed by me and considered in my medical decision making (see chart for details).  Clinical Course as of Jul 27 1512  Wed Jul 27, 2017  1142 Reassessed.  Blood pressure and heart rate are stable.  Patient reports lessening of the burning sensation in her throat.   [AM]  1501 Patient reassessed.  All symptoms have resolved.  Patient hemodynamically stable and tolerating p.o.  Patient stable for discharge.   [AM]    Clinical Course User Index [AM] Albesa Seen, PA-C    Patient is nontoxic-appearing and hemodynamically stable.  Patient with complete resolution of symptoms after 3-hour observational period.  Patient given prescription for prednisone for remainder of 2 days of therapy, instructed to take Benadryl and Pepcid.  Patient  given return precautions for any wheezing, coughing, abdominal cramping, or further urticarial symptoms.  Patient reports that she has an EpiPen waiting for her at the pharmacy.  Patient is in understanding and agrees with plan of care.  Final Clinical Impressions(s) / ED Diagnoses   Final diagnoses:  Allergic reaction to insect bite    ED Discharge Orders        Ordered    predniSONE (DELTASONE) 10 MG tablet  3 times daily with meals     07/27/17 1502       Albesa Seen, PA-C 07/27/17 1517    Daleen Bo, MD 07/28/17 [phone]

## 2017-08-29 ENCOUNTER — Encounter: Payer: Self-pay | Admitting: Allergy and Immunology

## 2017-08-29 ENCOUNTER — Ambulatory Visit: Payer: BC Managed Care – PPO | Admitting: Allergy and Immunology

## 2017-08-29 VITALS — BP 108/66 | HR 87 | Resp 16 | Ht 70.0 in | Wt 247.2 lb

## 2017-08-29 DIAGNOSIS — Z91038 Other insect allergy status: Secondary | ICD-10-CM | POA: Diagnosis not present

## 2017-08-29 DIAGNOSIS — G43611 Persistent migraine aura with cerebral infarction, intractable, with status migrainosus: Secondary | ICD-10-CM | POA: Insufficient documentation

## 2017-08-29 DIAGNOSIS — H1010 Acute atopic conjunctivitis, unspecified eye: Secondary | ICD-10-CM | POA: Insufficient documentation

## 2017-08-29 DIAGNOSIS — H1013 Acute atopic conjunctivitis, bilateral: Secondary | ICD-10-CM | POA: Diagnosis not present

## 2017-08-29 DIAGNOSIS — Z91018 Allergy to other foods: Secondary | ICD-10-CM | POA: Diagnosis not present

## 2017-08-29 DIAGNOSIS — F17210 Nicotine dependence, cigarettes, uncomplicated: Secondary | ICD-10-CM

## 2017-08-29 DIAGNOSIS — J329 Chronic sinusitis, unspecified: Secondary | ICD-10-CM

## 2017-08-29 DIAGNOSIS — J4520 Mild intermittent asthma, uncomplicated: Secondary | ICD-10-CM | POA: Insufficient documentation

## 2017-08-29 DIAGNOSIS — J3089 Other allergic rhinitis: Secondary | ICD-10-CM | POA: Diagnosis not present

## 2017-08-29 DIAGNOSIS — I639 Cerebral infarction, unspecified: Secondary | ICD-10-CM | POA: Insufficient documentation

## 2017-08-29 MED ORDER — CARBINOXAMINE MALEATE 4 MG PO TABS: ORAL_TABLET | ORAL | 5 refills | Status: DC

## 2017-08-29 MED ORDER — OLOPATADINE HCL 0.7 % OP SOLN: 1.0000 [drp] | OPHTHALMIC | 5 refills | Status: DC

## 2017-08-29 MED ORDER — AZELASTINE HCL 0.15 % NA SOLN: 1.0000 | Freq: Two times a day (BID) | NASAL | 5 refills | Status: DC | PRN

## 2017-08-29 NOTE — Assessment & Plan Note (Signed)
   Tobacco cessation has been discussed and encouraged.  Continue albuterol HFA, 1 to 2 inhalations every 6 hours if needed.  Subjective and objective measures of pulmonary function will be followed and the treatment plan will be adjusted accordingly.

## 2017-08-29 NOTE — Patient Instructions (Addendum)
History of food allergy The patient's history suggests the possibility of food allergies, however food allergen skin tests today were negative despite positive histamine control.  Food allergen skin testing has excellent negative predictive value (approximately 95%).  Laboratory order form has been provided for serum specific IgE against cows milk, cows milk components, pepper panel, raspberry, coconut, and pine nut.  When lab results have returned the patient will be called with further recommendations and follow up instructions.  For now, continue avoidance of these foods and have access to epinephrine auto-injector.  Seasonal and perennial allergic rhinitis  Aeroallergen avoidance measures have been discussed and provided in written form.  A prescription has been provided for carbinoxamine 4 mg every 6-8 hours if needed.  A prescription has been provided for azelastine nasal spray, 1-2 sprays per nostril 2 times daily as needed. Proper nasal spray technique has been discussed and demonstrated.   Nasal saline spray (i.e., Simply Saline) or nasal saline lavage (i.e., NeilMed) is recommended as needed and prior to medicated nasal sprays.  If allergen avoidance measures and medications fail to adequately relieve symptoms, aeroallergen immunotherapy will be considered.  Allergic conjunctivitis  Treatment plan as outlined above for allergic rhinitis.  A prescription has been provided for Pazeo, one drop per eye daily as needed.  I have also recommended eye lubricant drops (i.e., Natural Tears) as needed.  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  Laboratory order form has been provided for baseline serum tryptase level and serum specific IgE against venom panel.  Continue careful avoidance of flying insects and have access to epinephrine autoinjector 2 pack.  Recurrent sinus infections Immunocompetence will be assessed with  labs.  The following labs have been ordered: CBC with differential, IgG, IgA and IgM, tetanus IgG titers, and pneumococcal IgG titers.  If pre-vaccination titers are low, post-vaccination titers will be drawn to assess response.    The patient will be called with further recommendations and follow-up instructions once the labs have returned.  Mild intermittent asthma  Tobacco cessation has been discussed and encouraged.  Continue albuterol HFA, 1 to 2 inhalations every 6 hours if needed.  Subjective and objective measures of pulmonary function will be followed and the treatment plan will be adjusted accordingly.   When lab results have returned the patient will be called with further recommendations and follow up instructions.  Reducing Pollen Exposure  The American Academy of Allergy, Asthma and Immunology suggests the following steps to reduce your exposure to pollen during allergy seasons.    1. Do not hang sheets or clothing out to dry; pollen may collect on these items. 2. Do not mow lawns or spend time around freshly cut grass; mowing stirs up pollen. 3. Keep windows closed at night.  Keep car windows closed while driving. 4. Minimize morning activities outdoors, a time when pollen counts are usually at their highest. 5. Stay indoors as much as possible when pollen counts or humidity is high and on windy days when pollen tends to remain in the air longer. 6. Use air conditioning when possible.  Many air conditioners have filters that trap the pollen spores. 7. Use a HEPA room air filter to remove pollen form the indoor air you breathe.   Control of House Dust Mite Allergen  House dust mites play a major role in allergic asthma and rhinitis.  They occur in environments with high humidity wherever human skin, the food for dust mites is found. High levels have been detected  in dust obtained from mattresses, pillows, carpets, upholstered furniture, bed covers, clothes and soft  toys.  The principal allergen of the house dust mite is found in its feces.  A gram of dust may contain 1,000 mites and 250,000 fecal particles.  Mite antigen is easily measured in the air during house cleaning activities.    1. Encase mattresses, including the box spring, and pillow, in an air tight cover.  Seal the zipper end of the encased mattresses with wide adhesive tape. 2. Wash the bedding in water of 130 degrees Farenheit weekly.  Avoid cotton comforters/quilts and flannel bedding: the most ideal bed covering is the dacron comforter. 3. Remove all upholstered furniture from the bedroom. 4. Remove carpets, carpet padding, rugs, and non-washable window drapes from the bedroom.  Wash drapes weekly or use plastic window coverings. 5. Remove all non-washable stuffed toys from the bedroom.  Wash stuffed toys weekly. 6. Have the room cleaned frequently with a vacuum cleaner and a damp dust-mop.  The patient should not be in a room which is being cleaned and should wait 1 hour after cleaning before going into the room. 7. Close and seal all heating outlets in the bedroom.  Otherwise, the room will become filled with dust-laden air.  An electric heater can be used to heat the room. Reduce indoor humidity to less than 50%.  Do not use a humidifier.  Control of Dog or Cat Allergen  Avoidance is the best way to manage a dog or cat allergy. If you have a dog or cat and are allergic to dog or cats, consider removing the dog or cat from the home. If you have a dog or cat but don't want to find it a new home, or if your family wants a pet even though someone in the household is allergic, here are some strategies that may help keep symptoms at bay:  1. Keep the pet out of your bedroom and restrict it to only a few rooms. Be advised that keeping the dog or cat in only one room will not limit the allergens to that room. 2. Don't pet, hug or kiss the dog or cat; if you do, wash your hands with soap and  water. 3. High-efficiency particulate air (HEPA) cleaners run continuously in a bedroom or living room can reduce allergen levels over time. 4. Place electrostatic material sheet in the air inlet vent in the bedroom. 5. Regular use of a high-efficiency vacuum cleaner or a central vacuum can reduce allergen levels. 6. Giving your dog or cat a bath at least once a week can reduce airborne allergen.  Control of Mold Allergen  Mold and fungi can grow on a variety of surfaces provided certain temperature and moisture conditions exist.  Outdoor molds grow on plants, decaying vegetation and soil.  The major outdoor mold, Alternaria and Cladosporium, are found in very high numbers during hot and dry conditions.  Generally, a late Summer - Fall peak is seen for common outdoor fungal spores.  Rain will temporarily lower outdoor mold spore count, but counts rise rapidly when the rainy period ends.  The most important indoor molds are Aspergillus and Penicillium.  Dark, humid and poorly ventilated basements are ideal sites for mold growth.  The next most common sites of mold growth are the bathroom and the kitchen.  Outdoor [employer] 1. Use air conditioning and keep windows closed 2. Avoid exposure to decaying vegetation. 3. Avoid leaf raking. 4. Avoid grain handling. 5. Consider  wearing a face mask if working in moldy areas.  Indoor Mold Control 1. Maintain humidity below 50%. 2. Clean washable surfaces with 5% bleach solution. 3. Remove sources e.g. Contaminated carpets.

## 2017-08-29 NOTE — Progress Notes (Signed)
New Patient Note  RE: [name] MRN: [id number] DOB: December 01, 1985 Date of Office Visit: 08/29/2017  Referring provider: Charlesetta Shanks, MD Primary care provider: Ileana Ladd, MD  Chief Complaint: Allergic Reaction; Food Intolerance; and Allergic Rhinitis    History of present illness: [name] is a 32 y.o. female seen today in consultation requested by Charlesetta Shanks, MD. She reports that when consuming raspberries, coconut's, and red peppers she has been experiencing "random reactions" since January.  Typically with consumption of these foods she had been experiencing dyspnea and chest tightness.  However, she notes that she has consumed these foods, with the exception of the raspberry, since that time without symptoms.  She experiences bloating and abdominal discomfort with the consumption of cow's milk.  Approximately 15 years ago, she consumed pine nuts on 2 occasions resulting in lip swelling and throat tightness.  Therefore, she has carefully avoided pine nuts since that time and has access to epinephrine autoinjector. When she was 32 years old, she was stung by a wasp and had an anaphylactic reaction.  At the end of June 2019, she was again stung by a wasp resulting in anaphylaxis requiring epinephrine autoinjector.  She has never been tested for hymenoptera venoms.  As stated above, she has access to epinephrine autoinjectors. She experiences nasal congestion, rhinorrhea, sneezing, postnasal drainage, nasal pruritus, ocular pruritus, and sinus pressure.  These symptoms occur year-round but tend to be more frequent and severe during the springtime and in the fall.  She states that she has sinus infections "constantly."  She had tonsillectomy and adenoidectomy when she was 32 years old and also had PE tubes placed as a child. [name] has had asthma symptoms since infancy.  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 She smokes quarter pack of cigarettes per day and has done so over the past 20 years.  Assessment and plan: History of food allergy The patient's history suggests the possibility of food allergies, however food allergen skin tests today were negative despite positive histamine control.  Food allergen skin testing has excellent negative predictive value (approximately 95%).  Laboratory order form has been provided for serum specific IgE against cows milk, cows milk components, pepper panel, raspberry, coconut, and pine nut.  When lab results have returned the patient will be called with further recommendations and follow up instructions.  For now, continue avoidance of these foods and have access to epinephrine auto-injector.  Seasonal and perennial allergic rhinitis  Aeroallergen avoidance measures have been discussed and provided in written form.  A prescription has been provided for carbinoxamine 4 mg every 6-8 hours if needed.  A prescription has been provided for azelastine nasal spray, 1-2 sprays per nostril 2 times daily as needed. Proper nasal spray technique has been discussed and demonstrated.   Nasal saline spray (i.e., Simply Saline) or nasal saline lavage (i.e., NeilMed) is recommended as needed and prior to medicated nasal sprays.  If allergen avoidance measures and medications fail to adequately relieve symptoms, aeroallergen immunotherapy will be considered.  Allergic conjunctivitis  Treatment plan as outlined above for allergic rhinitis.  A prescription has been provided for Pazeo, one drop per eye daily as needed.  I have also recommended eye lubricant drops (i.e., Natural Tears) as needed.  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  Laboratory order form has been provided for baseline serum tryptase level and serum specific IgE against  venom panel.  Continue careful avoidance of flying  insects and have access to epinephrine autoinjector 2 pack.  Recurrent sinus infections Immunocompetence will be assessed with labs.  The following labs have been ordered: CBC with differential, IgG, IgA and IgM, tetanus IgG titers, and pneumococcal IgG titers.  If pre-vaccination titers are low, post-vaccination titers will be drawn to assess response.    The patient will be called with further recommendations and follow-up instructions once the labs have returned.  Mild intermittent asthma  Tobacco cessation has been discussed and encouraged.  Continue albuterol HFA, 1 to 2 inhalations every 6 hours if needed.  Subjective and objective measures of pulmonary function will be followed and the treatment plan will be adjusted accordingly.   Meds ordered this encounter  Medications  . Azelastine HCl 0.15 % SOLN    Sig: Place 1-2 sprays into both nostrils 2 (two) times daily as needed.    Dispense:  30 mL    Refill:  5  . Olopatadine HCl (PAZEO) 0.7 % SOLN    Sig: Place 1 drop into both eyes 1 day or 1 dose.    Dispense:  1 Bottle    Refill:  5  . Carbinoxamine Maleate 4 MG TABS    Sig: 1 tablet by mouth every 6-8 hours as needed    Dispense:  120 each    Refill:  5    Diagnostics: Spirometry: FVC was 4.51 L (99% predicted) and FEV1 was 2.86 L (76% predicted) with an FEV1 ratio of 75%.  There was 130 mL (5%) postbronchodilator improvement.  Mild airways obstruction without significant reversibility.  This study was performed while the patient was asymptomatic.  Please see scanned spirometry results for details. Epicutaneous testing: Positive to grass pollen, weed pollen, ragweed pollen, tree pollen, and dust mite antigen. Intradermal testing: Positive to cat hair. Food allergen skin testing: Negative despite a positive histamine control.    Physical examination: Blood pressure 108/66, pulse 87, resp. rate 16, height 5\' 10"  (1.778 m), weight 247 lb 3.2 oz (112.1 kg), last  menstrual period 08/14/2017, SpO2 97 %.  General: Alert, interactive, in no acute distress. HEENT: TMs pearly gray, turbinates edematous and pale with clear discharge, post-pharynx moderately erythematous. Neck: Supple without lymphadenopathy. Lungs: Clear to auscultation without wheezing, rhonchi or rales. CV: Normal S1, S2 without murmurs. Abdomen: Nondistended, nontender. Skin: Warm and dry, without lesions or rashes. Extremities:  No clubbing, cyanosis or edema. Neuro:   Grossly intact.  Review of systems:  Review of systems negative except as noted in HPI / PMHx or noted below: Review of Systems  Constitutional: Negative.   HENT: Negative.   Eyes: Negative.   Respiratory: Negative.   Cardiovascular: Negative.   Gastrointestinal: Negative.   Genitourinary: Negative.   Musculoskeletal: Negative.   Skin: Negative.   Neurological: Negative.   Endo/Heme/Allergies: Negative.   Psychiatric/Behavioral: Negative.     Past medical history:  Past Medical History:  Diagnosis Date  . Asthma   . Celiac disease   . Hypertension     Past surgical history:  Past Surgical History:  Procedure Laterality Date  . ADENOIDECTOMY    . DILATION AND CURETTAGE OF UTERUS  01/2017   polyp removed   . FOOT SURGERY Left   . HIP SURGERY    . TONSILLECTOMY      Family history: Family History  Problem Relation Age of Onset  . Transient ischemic attack Mother 63  . Hyperlipidemia Mother 71  . Mitral valve  prolapse Mother   . Thyroid disease Mother   . Asthma Mother   . Healthy Father   . Diabetes Maternal Grandmother   . Hyperlipidemia Maternal Grandmother   . Kidney disease Maternal Grandmother   . Alzheimer's disease Maternal Grandmother   . Skin cancer Paternal Grandfather   . Heart attack Maternal Grandfather 63  . Emphysema Maternal Grandfather        smoked  . Alpha-1 antitrypsin deficiency Maternal Grandfather   . Other Paternal Grandmother        neuroendocrine cancer  .  Skin cancer Paternal Grandmother   . Hypertension Paternal Grandmother   . Healthy Brother   . Thyroid disease Sister   . Cystic fibrosis Sister        carrier    Social history: Social History   Socioeconomic History  . Marital status: Married    Spouse name: Not on file  . Number of children: Not on file  . Years of education: Not on file  . Highest education level: Not on file  Occupational History  . Occupation: Runner, broadcasting/film/video  Social Needs  . Financial resource strain: Not on file  . Food insecurity:    Worry: Not on file    Inability: Not on file  . Transportation needs:    Medical: Not on file    Non-medical: Not on file  Tobacco Use  . Smoking status: Current Every Day Smoker    Packs/day: 0.25    Years: 16.00    Pack years: 4.00    Types: Cigarettes  . Smokeless tobacco: Never Used  Substance and Sexual Activity  . Alcohol use: Yes    Alcohol/week: 0.0 oz    Comment: 2 glasses a week  . Drug use: No  . Sexual activity: Yes    Partners: Male    Comment: 1st intercourse-, partners- , married -  9 yrs   Lifestyle  . Physical activity:    Days per week: Not on file    Minutes per session: Not on file  . Stress: Not on file  Relationships  . Social connections:    Talks on phone: Not on file    Gets together: Not on file    Attends religious service: Not on file    Active member of club or organization: Not on file    Attends meetings of clubs or organizations: Not on file    Relationship status: Not on file  . Intimate partner violence:    Fear of current or ex partner: Not on file    Emotionally abused: Not on file    Physically abused: Not on file    Forced sexual activity: Not on file  Other Topics Concern  . Not on file  Social History Narrative  . Not on file   Environmental History: The patient lives in a 34 year old house with carpeting throughout, gas heat, and central air.  There is no known mold/water damage in the home.  There are no pets in  the home.  She smokes quarter pack of cigarettes per day and has done so over the past 20 years.  Allergies as of 08/29/2017      Reactions   Avelox [moxifloxacin] Shortness Of Breath   Cephalosporins Shortness Of Breath   CP   Food Anaphylaxis   "pine nuts"   Penicillins Anaphylaxis   Sulfa Antibiotics Shortness Of Breath   Biaxin [clarithromycin] Nausea And Vomiting      Medication List  Accurate as of 08/29/17  6:55 PM. Always use your most recent med list.          Azelastine HCl 0.15 % Soln Place 1-2 sprays into both nostrils 2 (two) times daily as needed.   b complex vitamins tablet Take 1 tablet by mouth daily.   CAL-MAG-ZINC PO Take by mouth daily.   Carbinoxamine Maleate 4 MG Tabs 1 tablet by mouth every 6-8 hours as needed   cyclobenzaprine 5 MG tablet Commonly known as:  FLEXERIL TAKE 1 TABLET BY MOUTH THREE TIMES DAILY AS NEEDED FOR MUSCLE SPASM FOR 14 DAYS   EPINEPHrine 0.3 mg/0.3 mL Soaj injection Commonly known as:  EPI-PEN INJECT CONTENTS OF 1 PEN AS NEEDED FOR ALLERGIC REACTION   famotidine 20 MG tablet Commonly known as:  PEPCID Take 20 mg by mouth 2 (two) times daily.   loratadine 10 MG tablet Commonly known as:  CLARITIN Take 10 mg by mouth daily.   Olopatadine HCl 0.7 % Soln Commonly known as:  PAZEO Place 1 drop into both eyes 1 day or 1 dose.   Omega-3 1000 MG Caps Take 1 capsule by mouth daily.   PRENATAL VITAMIN PO Take 1 tablet by mouth daily.   PROAIR RESPICLICK 108 (90 Base) MCG/ACT Aepb Generic drug:  Albuterol Sulfate Inhale 2 puffs into the lungs every 4 (four) hours as needed.   sertraline 50 MG tablet Commonly known as:  ZOLOFT Take 1 tablet (50 mg total) by mouth daily.   SOLUBLE FIBER/PROBIOTICS Chew Chew by mouth.   Vitamin D3 2000 units capsule Take 2,000 Units by mouth daily.       Known medication allergies: Allergies  Allergen Reactions  . Avelox [Moxifloxacin] Shortness Of Breath  .  Cephalosporins Shortness Of Breath    CP  . Food Anaphylaxis    "pine nuts"  . Penicillins Anaphylaxis  . Sulfa Antibiotics Shortness Of Breath  . Biaxin [Clarithromycin] Nausea And Vomiting    I appreciate the opportunity to take part in Ayra's care. Please do not hesitate to contact me with questions.  Sincerely,   R. Jorene Guest, MD

## 2017-08-29 NOTE — Assessment & Plan Note (Signed)
   Treatment plan as outlined above for allergic rhinitis.  A prescription has been provided for Pazeo, one drop per eye daily as needed.  I have also recommended eye lubricant drops (i.e., Natural Tears) as needed. 

## 2017-08-29 NOTE — Assessment & Plan Note (Signed)
The patient's history strongly suggest hymenoptera hypersensitivity.  Laboratory order form has been provided for baseline serum tryptase level and serum specific IgE against venom panel.  Continue careful avoidance of flying insects and have access to epinephrine autoinjector 2 pack.

## 2017-08-29 NOTE — Assessment & Plan Note (Signed)
   Immunocompetence will be assessed with labs.  The following labs have been ordered: CBC with differential, IgG, IgA and IgM, tetanus IgG titers, and pneumococcal IgG titers.  If pre-vaccination titers are low, post-vaccination titers will be drawn to assess response.    The patient will be called with further recommendations and follow-up instructions once the labs have returned. 

## 2017-08-29 NOTE — Assessment & Plan Note (Addendum)
The patient's history suggests the possibility of food allergies, however food allergen skin tests today were negative despite positive histamine control.  Food allergen skin testing has excellent negative predictive value (approximately 95%).  Laboratory order form has been provided for serum specific IgE against cows milk, cows milk components, pepper panel, raspberry, coconut, and pine nut.  When lab results have returned the patient will be called with further recommendations and follow up instructions.  For now, continue avoidance of these foods and have access to epinephrine auto-injector.

## 2017-08-29 NOTE — Assessment & Plan Note (Addendum)
   Aeroallergen avoidance measures have been discussed and provided in written form.  A prescription has been provided for carbinoxamine 4 mg every 6-8 hours if needed.  A prescription has been provided for azelastine nasal spray, 1-2 sprays per nostril 2 times daily as needed. Proper nasal spray technique has been discussed and demonstrated.   Nasal saline spray (i.e., Simply Saline) or nasal saline lavage (i.e., NeilMed) is recommended as needed and prior to medicated nasal sprays.  If allergen avoidance measures and medications fail to adequately relieve symptoms, aeroallergen immunotherapy will be considered. 

## 2017-08-30 ENCOUNTER — Telehealth: Payer: Self-pay | Admitting: Allergy & Immunology

## 2017-08-30 NOTE — Telephone Encounter (Signed)
Left message to return call and inform us how she is feeling.

## 2017-08-30 NOTE — Telephone Encounter (Signed)
Patient called back states she took more benadryl and did her inhaler and started feeling better. Also this morning she had trouble breathing and used her inhaler again she Is doing better now.

## 2017-08-30 NOTE — Telephone Encounter (Signed)
I received a call from [name].  She had been seen by Dr. Nunzio Cobbs today and received a nebulizer treatment as well as allergy testing today.  She reports that she is having some chest tightness, without difficulty breathing.  She is also having quite a bit of itching around the site of the testing.  She had already taken 2 doses of 25 mg diphenhydramine with some relief.  She was able to speak in full sentences and did not appear in distress.  I recommended that she take cetirizine 2 tablets now.  I also recommended that she take her albuterol to see if that can help with the chest tightness.  We will call her this morning with an update.  Malachi Bonds, MD [facility]

## 2017-09-05 LAB — ALLERGEN COCONUT IGE: Allergen Coconut IgE: <0.1 kU/L

## 2017-09-05 LAB — STREP PNEUMONIAE 23 SEROTYPES IGG
Pneumo Ab Type 1*: 1.5 ug/mL (ref >1.3)
Pneumo Ab Type 12 (12F)*: 0.3 ug/mL — ABNORMAL LOW (ref >1.3)
Pneumo Ab Type 14*: >17.4 ug/mL (ref >1.3)
Pneumo Ab Type 17 (17F)*: 1.8 ug/mL (ref >1.3)
Pneumo Ab Type 19 (19F)*: 4.9 ug/mL (ref >1.3)
Pneumo Ab Type 2*: >25.3 ug/mL (ref >1.3)
Pneumo Ab Type 20*: 2 ug/mL (ref >1.3)
Pneumo Ab Type 22 (22F)*: 3.3 ug/mL (ref >1.3)
Pneumo Ab Type 23 (23F)*: 0.7 ug/mL — ABNORMAL LOW (ref >1.3)
Pneumo Ab Type 26 (6B)*: 3.8 ug/mL (ref >1.3)
Pneumo Ab Type 3*: 1.8 ug/mL (ref >1.3)
Pneumo Ab Type 34 (10A)*: 0.3 ug/mL — ABNORMAL LOW (ref >1.3)
Pneumo Ab Type 4*: 0.5 ug/mL — ABNORMAL LOW (ref >1.3)
Pneumo Ab Type 43 (11A)*: 0.4 ug/mL — ABNORMAL LOW (ref >1.3)
Pneumo Ab Type 5*: 3.2 ug/mL (ref >1.3)
Pneumo Ab Type 51 (7F)*: 0.7 ug/mL — ABNORMAL LOW (ref >1.3)
Pneumo Ab Type 54 (15B)*: 3.8 ug/mL (ref >1.3)
Pneumo Ab Type 56 (18C)*: 9.9 ug/mL (ref >1.3)
Pneumo Ab Type 57 (19A)*: 2.8 ug/mL (ref >1.3)
Pneumo Ab Type 68 (9V)*: 0.1 ug/mL — ABNORMAL LOW (ref >1.3)
Pneumo Ab Type 70 (33F)*: 1.5 ug/mL (ref >1.3)
Pneumo Ab Type 8*: 0.9 ug/mL — ABNORMAL LOW (ref >1.3)
Pneumo Ab Type 9 (9N)*: 3.6 ug/mL (ref >1.3)

## 2017-09-05 LAB — MILK COMPONENT PANEL
F076-IgE Alpha Lactalbumin: <0.1 kU/L
F077-IgE Beta Lactoglobulin: <0.1 kU/L
F078-IgE Casein: <0.1 kU/L

## 2017-09-05 LAB — IGG, IGA, IGM
IgA/Immunoglobulin A, Serum: 365 mg/dL — ABNORMAL HIGH (ref 87–352)
IgG (Immunoglobin G), Serum: 990 mg/dL (ref 700–1600)
IgM (Immunoglobulin M), Srm: 100 mg/dL (ref 26–217)

## 2017-09-05 LAB — CBC WITH DIFFERENTIAL/PLATELET
Basophils Absolute: 0 10*3/uL (ref 0.0–0.2)
Basos: 0 %
EOS (ABSOLUTE): 0 10*3/uL (ref 0.0–0.4)
Eos: 1 %
Hematocrit: 38.8 % (ref 34.0–46.6)
Hemoglobin: 11.9 g/dL (ref 11.1–15.9)
Immature Grans (Abs): 0 10*3/uL (ref 0.0–0.1)
Immature Granulocytes: 0 %
Lymphocytes Absolute: 2.4 10*3/uL (ref 0.7–3.1)
Lymphs: 27 %
MCH: 28.7 pg (ref 26.6–33.0)
MCHC: 30.7 g/dL — ABNORMAL LOW (ref 31.5–35.7)
MCV: 94 fL (ref 79–97)
Monocytes Absolute: 0.6 10*3/uL (ref 0.1–0.9)
Monocytes: 7 %
Neutrophils Absolute: 5.8 10*3/uL (ref 1.4–7.0)
Neutrophils: 65 %
Platelets: 288 10*3/uL (ref 150–450)
RBC: 4.14 x10E6/uL (ref 3.77–5.28)
RDW: 13.9 % (ref 12.3–15.4)
WBC: 8.8 10*3/uL (ref 3.4–10.8)

## 2017-09-05 LAB — DIPHTHERIA / TETANUS ANTIBODY PANEL
Diphtheria Ab: 0.18 IU/mL (ref <0.10)
Tetanus Ab, IgG: 1.12 IU/mL (ref <0.10)

## 2017-09-05 LAB — ALLERGEN HYMENOPTERA PANEL
Bumblebee: <0.1 kU/L
Honeybee IgE: <0.1 kU/L
Hornet, White Face, IgE: 3.84 kU/L — AB
Hornet, Yellow, IgE: 2.38 kU/L — AB
Paper Wasp IgE: 9.28 kU/L — AB
Yellow Jacket, IgE: 5.64 kU/L — AB

## 2017-09-05 LAB — F253-IGE PINE NUT, PIGNOLES: Pine Nut IgE: <0.1 kU/L

## 2017-09-05 LAB — ALLERGEN,CHILI PEPPER,RF279: F279-IgE Chili Pepper: <0.1 kU/L

## 2017-09-05 LAB — ALLERGEN MILK: Milk IgE: <0.1 kU/L

## 2017-09-05 LAB — F263-IGE GREEN BELL PEPPER: Allergen Green Bell Pepper IgE: <0.1 kU/L

## 2017-09-05 LAB — ALLERGEN, BLACK PEPPER,F280: Allergen Black Pepper IgE: <0.1 kU/L

## 2017-09-05 LAB — JALAPENO PEPPER, IGE
Class Interpretation: 0
Jalapeno Pepper: <0.35 kU/L (ref <0.35)

## 2017-09-05 LAB — ALLERGEN, RASPBERRY, RF343: F343-IgE Raspberry: <0.1 kU/L

## 2017-09-07 ENCOUNTER — Telehealth: Payer: Self-pay

## 2017-09-07 NOTE — Telephone Encounter (Signed)
Patient received a notification on mychart saying her results were back..  Please Advise

## 2017-09-07 NOTE — Telephone Encounter (Signed)
When multiple labs were checked, being slightly outside of the normal range on 1 or 2 labs is not unusual.  The fact that the MCHC level is only slightly outside of the normal range in the context of all other labs and the CBC being normal, it is unlikely that it is clinically relevant.  If she is concerned we can recheck the CBC.

## 2017-09-07 NOTE — Telephone Encounter (Addendum)
Spoke to patient advised of lab results. Patient verbalized understanding will mail her the food challenge protocol she will think about it and call us to set up appointment. Patient also stated that the IDs are still a bit red advised to apply hydrocortisone and warm compresses to relief this. Dr Nunzio Cobbs patient would like to know why her MCHC levels were low?

## 2017-09-08 NOTE — Telephone Encounter (Signed)
Pt advised of doctor note
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
## 2017-09-14 ENCOUNTER — Encounter: Payer: Self-pay | Admitting: Neurology

## 2017-09-14 ENCOUNTER — Ambulatory Visit: Payer: BC Managed Care – PPO | Admitting: Neurology

## 2017-09-14 VITALS — BP 94/66 | HR 83 | Ht 70.0 in | Wt 246.0 lb

## 2017-09-14 DIAGNOSIS — R200 Anesthesia of skin: Secondary | ICD-10-CM

## 2017-09-14 DIAGNOSIS — R202 Paresthesia of skin: Secondary | ICD-10-CM

## 2017-09-14 NOTE — Patient Instructions (Signed)
1.  We will check a nerve conduction study.  Further recommendations pending results 2.  Follow up after testing.

## 2017-09-21 ENCOUNTER — Telehealth: Payer: Self-pay

## 2017-09-21 NOTE — Telephone Encounter (Signed)
Yes, have her carefully avoid coconut and sesame and have access to epinephrine autoinjectors. Should symptoms recur, a journal is to be kept recording any foods eaten, beverages consumed, and medications taken within a 6 hour time period prior to the onset of symptoms, as well as record activities being performed, and environmental conditions. For any symptoms concerning for anaphylaxis, epinephrine is to be administered and 911 is to be called immediately.

## 2017-09-21 NOTE — Telephone Encounter (Signed)
Patient called complaining of a reaction last night to coconut/sesame. States that she had severe lip swelling, chest tightness and wheezing. Denied epi use. She did take 50 mg diphenhydramine and that helped. Patient had consumed both coconut milk & coconut aminos prior to this and had no issues. Her skin testing was negative to both. Her labs showed no sensitivity to coconut. I advised patient to avoid both of these until she has heard from [place]. Patient was agreeable with this plan. Please advise and thank you.

## 2017-09-22 NOTE — Telephone Encounter (Signed)
Patient has been informed of the journal recommendation. She did acknowledge advice given.

## 2017-10-04 ENCOUNTER — Ambulatory Visit (INDEPENDENT_AMBULATORY_CARE_PROVIDER_SITE_OTHER): Payer: BC Managed Care – PPO | Admitting: Neurology

## 2017-10-04 DIAGNOSIS — R200 Anesthesia of skin: Secondary | ICD-10-CM

## 2017-10-04 DIAGNOSIS — R202 Paresthesia of skin: Secondary | ICD-10-CM

## 2017-10-04 NOTE — Procedures (Signed)
[facility]  [address] [place], [address]  [place], [place] [address] Tel: [phone] Fax:  [phone] Test Date:  10/04/2017  Patient: [name] DOB: [birth year] Physician: Nita Sickle, DO  Sex: Female Height: 5\' 10"  Ref Phys: Shon Millet, DO  ID#: [id number] Temp: 35.6C Technician:    Patient Complaints: This is a 32 year old female referred for evaluation of generalized paresthesias and pain.  NCV & EMG Findings: Extensive electrodiagnostic testing of the left upper and lower extremity shows:  1. All sensory responses including the left median, ulnar, mixed palmar, sural, and superficial nerves are within normal limits. 2. Left median, peroneal, and tibial motor responses are within normal limits. 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 3. Left tibial H reflex study is within normal limits. 4. There is no evidence of active or chronic motor axon loss changes affecting any of the tested muscles. Motor unit configuration and recruitment pattern is within normal limits.  Impression: This is a normal study of the left arm and leg. In particular, there is no evidence of a sensorimotor polyneuropathy or cervical/lumbosacral radiculopathy.  Incidentally, there is a left Riche Cannieu anastomosis, a normal variant.   ___________________________ Nita Sickle, DO    Nerve Conduction Studies Anti Sensory Summary Table   Site NR Peak (ms) Norm Peak (ms) P-T Amp (V) Norm P-T Amp  Left Median Anti Sensory (2nd Digit)  35.6C  Wrist    2.5 <3.4 53.0 >20  Left Sup Peroneal Anti Sensory (Ant Lat Mall)  35.6C  12 cm    2.2 <4.5 8.9 >5  Left Sural Anti Sensory (Lat Mall)  35.6C  Calf    3.2 <4.5 8.4 >5  Left Ulnar Anti Sensory (5th Digit)  35.6C  Wrist    2.4 <3.1 35.3 >12   Motor Summary Table   Site NR Onset (ms) Norm Onset (ms) O-P Amp  (mV) Norm O-P Amp Site1 Site2 Delta-0 (ms) Dist (cm) Vel (m/s) Norm Vel (m/s)  Left Median Motor (Abd Poll Brev)  35.6C  Wrist    2.6 <3.9 8.2 >6 Elbow Wrist 4.7 28.0 60 >50  Elbow    7.3  7.1         Left Peroneal Motor (Ext Dig Brev)  35.6C  Ankle    3.4 <5.5 3.1 >3 B Fib Ankle 7.5 39.0 52 >40  B Fib    10.9  2.8  Poplt B Fib 1.3 9.0 69 >40  Poplt    12.2  2.7         Left Tibial Motor (Abd Hall Brev)  35.6C  Ankle    5.0 <6.0 11.4 >8 Knee Ankle 7.9 42.0 53 >40  Knee    12.9  9.3         Left Ulnar Motor (Abd Dig Minimi)  35.6C  Wrist    1.9 <3.1 5.5 >7 B Elbow Wrist 3.7 25.0 68 >50  B Elbow    5.6  5.8  A Elbow B Elbow 1.5 10.0 67 >50  A Elbow    7.1  5.3  Median-wrist A Elbow 5.4 0.0    Median-wrist    1.7  4.5         Median-prox arm    8.0  4.8          Comparison Summary Table   Site NR Peak (ms) Norm Peak (ms) P-T  Amp (V) Site1 Site2 Delta-P (ms) Norm Delta (ms)  Left Median/Ulnar Palm Comparison (Wrist - 8cm)  35.6C  Median Palm    1.3 <2.2 77.8 Median Palm Ulnar Palm 0.1   Ulnar Palm    1.2 <2.2 29.8       H Reflex Studies   NR H-Lat (ms) Lat Norm (ms) L-R H-Lat (ms)  Left Tibial (Gastroc)  35.6C     34.42 <35    EMG   Side Muscle Ins Act Fibs Psw Fasc Number Recrt Dur Dur. Amp Amp. Poly Poly. Comment  Left AntTibialis Nml Nml Nml Nml Nml Nml Nml Nml Nml Nml Nml Nml N/A  Left Gastroc Nml Nml Nml Nml Nml Nml Nml Nml Nml Nml Nml Nml N/A  Left Flex Dig Long Nml Nml Nml Nml Nml Nml Nml Nml Nml Nml Nml Nml N/A  Left RectFemoris Nml Nml Nml Nml Nml Nml Nml Nml Nml Nml Nml Nml N/A  Left GluteusMed Nml Nml Nml Nml Nml Nml Nml Nml Nml Nml Nml Nml N/A  Left 1stDorInt Nml Nml Nml Nml Nml Nml Nml Nml Nml Nml Nml Nml N/A  Left PronatorTeres Nml Nml Nml Nml Nml Nml Nml Nml Nml Nml Nml Nml N/A  Left Biceps Nml Nml Nml Nml Nml Nml Nml Nml Nml Nml Nml Nml N/A  Left Triceps Nml Nml Nml Nml Nml Nml Nml Nml Nml Nml Nml Nml N/A  Left Deltoid Nml Nml Nml Nml Nml Nml Nml Nml Nml  Nml Nml Nml N/A  Left ABD Dig Min Nml Nml Nml Nml Nml Nml Nml Nml Nml Nml Nml Nml N/A     Waveforms:

## 2017-10-11 ENCOUNTER — Encounter: Payer: Self-pay | Admitting: *Deleted

## 2017-10-11 ENCOUNTER — Telehealth: Payer: Self-pay | Admitting: *Deleted

## 2017-10-11 NOTE — Telephone Encounter (Signed)
Results sent via My Chart.  

## 2017-10-11 NOTE — Telephone Encounter (Signed)
-----   Message from Drema Dallas, DO sent at 10/06/2017  1:24 PM EDT ----- The nerve test is normal.

## 2017-10-20 ENCOUNTER — Other Ambulatory Visit: Payer: Self-pay | Admitting: Family Medicine

## 2017-10-20 DIAGNOSIS — R1031 Right lower quadrant pain: Secondary | ICD-10-CM

## 2017-10-21 ENCOUNTER — Ambulatory Visit
Admission: RE | Admit: 2017-10-21 | Discharge: 2017-10-21 | Disposition: A | Discharge status: Home / Self Care | Payer: BC Managed Care – PPO | Source: Ambulatory Visit | Attending: Family Medicine | Admitting: Family Medicine

## 2017-10-21 DIAGNOSIS — R1031 Right lower quadrant pain: Secondary | ICD-10-CM

## 2017-10-21 MED ORDER — IOPAMIDOL (ISOVUE-300) INJECTION 61%
100.0000 mL | Freq: Once | INTRAVENOUS | Status: AC | PRN
  Administered 2017-10-21: 125 mL via INTRAVENOUS

## 2017-10-27 ENCOUNTER — Encounter: Payer: Self-pay | Admitting: Dietician

## 2017-10-27 ENCOUNTER — Encounter: Payer: BC Managed Care – PPO | Attending: Family Medicine | Admitting: Dietician

## 2017-10-27 DIAGNOSIS — K900 Celiac disease: Secondary | ICD-10-CM | POA: Insufficient documentation

## 2017-10-27 DIAGNOSIS — Z91018 Allergy to other foods: Secondary | ICD-10-CM

## 2017-10-27 DIAGNOSIS — Z713 Dietary counseling and surveillance: Secondary | ICD-10-CM | POA: Insufficient documentation

## 2017-10-27 DIAGNOSIS — K219 Gastro-esophageal reflux disease without esophagitis: Secondary | ICD-10-CM

## 2017-10-27 DIAGNOSIS — E669 Obesity, unspecified: Secondary | ICD-10-CM

## 2017-10-27 NOTE — Progress Notes (Signed)
Medical Nutrition Therapy:  Appt start time: 1620 end time:  1710.   Assessment:  Primary concerns today: Patient would like to decrease inflammation and still wants to lose weight before getting pregnant.  She is here alone.  She was last seen by me 02/14/17.  Her weight was 240 lbs at that time.  Weight increased to 248 lbs today.  History includes celiac and diverticulitis.  Since last visit.  She has been eating from home most often to avoid cross contamination of Gluten.  She does not feel that she has gotten cross contamination.  Dizziness has improved and she does not have concerns about her blood sugar at this time.  She complains for the past month that she has had nausea, lower abdominal bloating, right upper and left upper abdominal pain and worsening heartburn despite Pepcid.  Her MD has suggested a changed to Prilosec but patient has not started this yet.  Antibiotics helped most of the pain.  CT was negative.  She also notes that she is seeing large chunks of undigested vegetables in her stool.  She has at least 1 BM daily.  Patient lives with her husband who is a Physiological scientist.  Patient does most of the shopping and cooking.  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  She is trying to get pregnant.  She works out 5 times per week for 2 hours per day during the summer and this decreases to 3-4 times per week when school is in session.  She sleeps approximately 9 pm-5 am but recently has been waking up in the middle of the night for about 1 hour.  When at school, she eats standing up most of the time.  Preferred Learning Style:   No preference indicated   Learning Readiness:   Ready  Change in progress   MEDICATIONS: Pepcid, Vitamin D, B-complex, prenatal vitamin, Omega 3   DIETARY INTAKE:  Usual eating pattern includes 3 meals and 2 snacks per day. Avoided foods include Gluten  Nausea worsened by too much meat and certain vegetables (broccoli and  cauliflower) 24-hr recall:  B ( AM): 2 eggs, 2 cups coffee with flavored creamer  Snk ( AM): yogurt, fruit (nectarine or banana) OR handful of granola  L ( PM): leftovers Snk ( PM): GF beef stick and fruit OR yogurt if not in am and fruit D ( PM): ground [place], lettuce, cucumber, GF shredded cheese, GF salad dressing OR 2 chicken thighs or 1/2 chicken breast and broccoli Snk ( PM): none Beverages: water, 2 cups coffee with flavored cream  Usual physical activity: 2 hours 3-4 days per week  Estimated energy needs: 1700 calories 100 g protein  Progress Towards Goal(s):  In progress.   Nutritional Diagnosis:  NB-1.1 Food and nutrition-related knowledge deficit As related to nutrition and GI concerns.  As evidenced by patient report.    Intervention:  Nutrition counseling/education related to nutrition and GI concerns.  Discussed diet to see if there were any areas of cross contamination with gluten.  (House is now Gluten free).  Discussed her symptoms with what foods make this worse.  Discussed that coffee and any caffeine can made the dyspepsia worse.  Discussed beans as an alternative to meat which are better tolerated.  Discussed the importance of chewing her food well, stress control and importance of maintaining hydration. Resources provided and also sent her a note regarding emulsifiers that can cause GI issues for some (polysorbate 80, Carboxmethylcellulose,  and carrageenan) and suggested that she read, [employer] written by an RD.  She is to call if she has any questions or wants to consider a low FODMAP diet.  Encouraged her to consider adding more grains (brown rice, quinoa) to her diet but she states that when she tried, she gained weight.    Eat slowly, chew your food very well (20 times) Consider holding the prenatal vitamins temporarily to see how you feel. Be sure that you are staying hydrated.  Drink more water. Consider avoiding coffee and any  caffeine Stress control Continue the probiotic Consider FD guard trial or diglycerated licorice  Consider more beans  Nutrition Facts.org  Teaching Method Utilized:  Auditory   Barriers to learning/adherence to lifestyle change: time, food intolerances  Demonstrated degree of understanding via:  Teach Back   Monitoring/Evaluation:  Dietary intake, exercise, and body weight prn.

## 2017-10-27 NOTE — Patient Instructions (Addendum)
Eat slowly, chew your food very well (20 times) Consider holding the prenatal vitamins temporarily to see how you feel. Be sure that you are staying hydrated.  Drink more water. Consider avoiding coffee and any caffeine Stress control Continue the probiotic Consider FD guard trial or diglycerated licorice  Consider more beans  Nutrition Facts.org

## 2018-01-24 ENCOUNTER — Other Ambulatory Visit: Payer: Self-pay | Admitting: Obstetrics & Gynecology

## 2018-01-26 NOTE — Telephone Encounter (Signed)
annual exam due in April 2020

## 2018-07-07 ENCOUNTER — Other Ambulatory Visit: Payer: Self-pay | Admitting: Obstetrics & Gynecology

## 2018-08-24 ENCOUNTER — Telehealth (HOSPITAL_COMMUNITY): Payer: Self-pay | Admitting: *Deleted

## 2018-08-28 ENCOUNTER — Telehealth (HOSPITAL_COMMUNITY): Payer: Self-pay | Admitting: *Deleted

## 2018-08-28 ENCOUNTER — Encounter (HOSPITAL_COMMUNITY): Payer: Self-pay | Admitting: *Deleted

## 2018-08-28 NOTE — Telephone Encounter (Signed)
Preadmission screen  

## 2018-08-31 ENCOUNTER — Inpatient Hospital Stay (HOSPITAL_COMMUNITY)
Admission: AD | Admit: 2018-08-31 | Discharge: 2018-08-31 | Disposition: A | Discharge status: Home / Self Care | Payer: BC Managed Care – PPO | Attending: Obstetrics & Gynecology | Admitting: Obstetrics & Gynecology

## 2018-08-31 ENCOUNTER — Other Ambulatory Visit: Payer: Self-pay | Admitting: Obstetrics

## 2018-08-31 ENCOUNTER — Encounter (HOSPITAL_COMMUNITY): Payer: Self-pay | Admitting: *Deleted

## 2018-08-31 ENCOUNTER — Other Ambulatory Visit: Payer: Self-pay

## 2018-08-31 DIAGNOSIS — W19XXXA Unspecified fall, initial encounter: Secondary | ICD-10-CM | POA: Diagnosis not present

## 2018-08-31 DIAGNOSIS — Z3A39 39 weeks gestation of pregnancy: Secondary | ICD-10-CM | POA: Insufficient documentation

## 2018-08-31 DIAGNOSIS — O9A213 Injury, poisoning and certain other consequences of external causes complicating pregnancy, third trimester: Secondary | ICD-10-CM | POA: Diagnosis not present

## 2018-08-31 DIAGNOSIS — Z87891 Personal history of nicotine dependence: Secondary | ICD-10-CM | POA: Insufficient documentation

## 2018-08-31 DIAGNOSIS — Z3689 Encounter for other specified antenatal screening: Secondary | ICD-10-CM

## 2018-08-31 DIAGNOSIS — Z880 Allergy status to penicillin: Secondary | ICD-10-CM | POA: Insufficient documentation

## 2018-08-31 DIAGNOSIS — O26893 Other specified pregnancy related conditions, third trimester: Secondary | ICD-10-CM | POA: Insufficient documentation

## 2018-08-31 DIAGNOSIS — W010XXA Fall on same level from slipping, tripping and stumbling without subsequent striking against object, initial encounter: Secondary | ICD-10-CM | POA: Insufficient documentation

## 2018-08-31 HISTORY — DX: Anxiety disorder, unspecified: F41.9

## 2018-08-31 HISTORY — DX: Polyp of cervix uteri: N84.1

## 2018-08-31 HISTORY — DX: Unspecified infectious disease: B99.9

## 2018-08-31 HISTORY — DX: Headache, unspecified: R51.9

## 2018-08-31 HISTORY — DX: Polyp of corpus uteri: N84.0

## 2018-08-31 HISTORY — DX: Calculus of kidney: N20.0

## 2018-08-31 HISTORY — DX: Anemia, unspecified: D64.9

## 2018-08-31 NOTE — MAU Provider Note (Signed)
History     CSN: [id number]  Arrival date and time: 08/31/18 1524   First Provider Initiated Contact with Patient 08/31/18 1604      Chief Complaint  Patient presents with  . Fall   HPI NOHELANI BENNING is a 33 y.o. G1P0000 at [redacted]w[redacted]d who presents to MAU for evaluation following a fall today around 1330. Patient reports she tripped on the toe of her shoes and fell to knees at 1330 today. Neither her hands nor her abdomen made contact with the ground. Patient endorses vaginal spotting but states her cervix was checked in office today and she always spots after cervical exams. She denies overt vaginal bleeding, leaking of fluid, decreased fetal movement, fever, falls, or recent illness.   Patient is scheduled IOL Tues night/Wed morning  OB History    Gravida  1   Para  0   Term  0   Preterm  0   AB  0   Living  0     SAB  0   TAB  0   Ectopic  0   Multiple  0   Live Births  0           Past Medical History:  Diagnosis Date  . Anemia   . Anxiety   . Asthma    chronic bronchitis  . Celiac disease   . Cervical polyp   . Endometrial polyp   . Headache   . Infection    UTI  . Kidney stones     Past Surgical History:  Procedure Laterality Date  . ADENOIDECTOMY    . DILATION AND CURETTAGE OF UTERUS  01/2017   polyp removed   . FOOT SURGERY Left   . HIP SURGERY    . TONSILLECTOMY      Family History  Problem Relation Age of Onset  . Transient ischemic attack Mother 93  . Hyperlipidemia Mother 71  . Mitral valve prolapse Mother   . Thyroid disease Mother   . Asthma Mother   . Obesity Father   . Hearing loss Father   . Celiac disease Father   . Celiac disease Brother   . Diabetes Maternal Grandmother   . Hyperlipidemia Maternal Grandmother   . Kidney disease Maternal Grandmother   . Skin cancer Paternal Grandfather   . Heart disease Paternal Grandfather   . Heart attack Maternal Grandfather 63  . Emphysema Maternal Grandfather        smoked   . Alpha-1 antitrypsin deficiency Maternal Grandfather   . COPD Maternal Grandfather   . Other Paternal Grandmother        neuroendocrine cancer  . Skin cancer Paternal Grandmother   . Hypertension Paternal Grandmother   . Thyroid disease Sister   . Cystic fibrosis Sister     Social History   Tobacco Use  . Smoking status: Former Smoker    Packs/day: 0.25    Years: 16.00    Pack years: 4.00    Types: Cigarettes    Quit date: 12/28/2017    Years since quitting: 0.6  . Smokeless tobacco: Never Used  Substance Use Topics  . Alcohol use: Not Currently    Alcohol/week: 0.0 standard drinks    Comment: 2 glasses a week  . Drug use: No    Allergies:  Allergies  Allergen Reactions  . Avelox [Moxifloxacin] Shortness Of Breath  . Cephalosporins Shortness Of Breath    CP Other reaction(s): Chest pain  . Food Anaphylaxis    "  pine nuts"  . Penicillins Anaphylaxis  . Sulfa Antibiotics Shortness Of Breath  . Coconut Oil   . Biaxin [Clarithromycin] Nausea And Vomiting    Medications Prior to Admission  Medication Sig Dispense Refill Last Dose  . Azelastine HCl 0.15 % SOLN Place 1-2 sprays into both nostrils 2 (two) times daily as needed. 30 mL 5   . b complex vitamins tablet Take 1 tablet by mouth daily.     . Calcium-Magnesium-Zinc (CAL-MAG-ZINC PO) Take by mouth daily.     . Carbinoxamine Maleate 4 MG TABS 1 tablet by mouth every 6-8 hours as needed 120 each 5   . Cholecalciferol (VITAMIN D3) 2000 units capsule Take 2,000 Units by mouth daily.     . cyclobenzaprine (FLEXERIL) 5 MG tablet TAKE 1 TABLET BY MOUTH THREE TIMES DAILY AS NEEDED FOR MUSCLE SPASM FOR 14 DAYS  0   . EPINEPHrine 0.3 mg/0.3 mL IJ SOAJ injection INJECT CONTENTS OF 1 PEN AS NEEDED FOR ALLERGIC REACTION  1   . famotidine (PEPCID) 20 MG tablet Take 20 mg by mouth 2 (two) times daily.     Marland Kitchen. loratadine (CLARITIN) 10 MG tablet Take 10 mg by mouth daily.     . Olopatadine HCl (PAZEO) 0.7 % SOLN Place 1 drop into  both eyes 1 day or 1 dose. 1 Bottle 5   . Omega-3 1000 MG CAPS Take 1 capsule by mouth daily.     . Prenatal Vit-Fe Fumarate-FA (PRENATAL VITAMIN PO) Take 1 tablet by mouth daily.     Marland Kitchen. PROAIR RESPICLICK 108 (90 Base) MCG/ACT AEPB Inhale 2 puffs into the lungs every 4 (four) hours as needed.     . Probiotic Product (SOLUBLE FIBER/PROBIOTICS) CHEW Chew by mouth.       Review of Systems  Gastrointestinal: Negative for abdominal pain, nausea and vomiting.  Genitourinary: Negative for difficulty urinating, dysuria, flank pain and vaginal bleeding.  All other systems reviewed and are negative.  Physical Exam   Blood pressure 107/71, pulse 81, temperature 98.3 F (36.8 C), temperature source Oral, resp. rate 17, height 5\' 10"  (1.778 m), weight 129.7 kg, SpO2 98 %.  Physical Exam  Nursing note and vitals reviewed. Constitutional: She is oriented to person, place, and time. She appears well-developed and well-nourished.  Cardiovascular: Normal rate.  Respiratory: Effort normal.  GI: She exhibits no distension. There is no abdominal tenderness. There is no rebound and no guarding.  Gravid  Genitourinary:    No vaginal discharge.   Neurological: She is alert and oriented to person, place, and time.  Skin: Skin is dry.  Psychiatric: She has a normal mood and affect. Her behavior is normal. Judgment and thought content normal.    MAU Course/MDM  Procedures  Patient Vitals for the past 24 hrs:  BP Temp Temp src Pulse Resp SpO2 Height Weight  08/31/18 2014 (!) 104/59 (!) 97.4 F (36.3 C) Oral 70 17 - - -  08/31/18 1844 - - - - - 99 % - -  08/31/18 1637 (!) 96/51 - - 75 - 98 % - -  08/31/18 1600 107/71 - - 79 - 97 % - -  08/31/18 1558 107/71 98.3 F (36.8 C) Oral 81 17 98 % 5\' 10"  (1.778 m) 129.7 kg   --Reactive tracing: baseline 125, mod var, + accels, no decels --Toco: quiet  Assessment and Plan  --33 y.o. G1P0000 at 6232w6d  --Fall, initial encounter --S/p 4 hours continuous  monitoring, no concerning findings --Discharge home  in stable condition  F/U: Patient has IOL scheduled for Tuesday night at 2359  Calvert CantorSamantha C Tesslyn Baumert, [place] 08/31/2018, 8:23 PM

## 2018-08-31 NOTE — Discharge Instructions (Signed)
Preventing Injuries During Pregnancy ° °Injuries can happen during pregnancy. Minor falls and accidents usually do not harm you or your baby. But some injuries can harm you and your baby. Tell your doctor about any injury you suffer. °What can I do to avoid injuries? °Safety °· Remove rugs and loose objects on the floor. °· Wear comfortable shoes that have a good grip. Do not wear shoes that have high heels. °· Always wear your seat belt in the car. The lap belt should be below your belly. Always drive safely. °· Do not ride on a motorcycle. °Activity °· Do not take part in rough and violent activities or sports. °· Avoid: °? Walking on wet or slippery floors. °? Lifting heavy pots of boiling or hot liquids. °? Fixing electrical problems. °? Being near fires. °General instructions °· Take over-the-counter and prescription medicines only as told by your doctor. °· Know your blood type and the blood type of the baby's father. °· If you are a victim of domestic violence: °? Call your local emergency services (911 in the [place] °? Contact the National Domestic Violence Hotline for help and support. °Get help right away if: °· You fall on your belly or receive any serious blow to your belly. °· You have a stiff neck or neck pain after a fall or an injury. °· You get a headache or have problems with vision after an injury. °· You do not feel the baby move or the baby is not moving as much as normal. °· You have been a victim of domestic violence or any other kind of attack. °· You have been in a car accident. °· You have bleeding from your vagina. °· Fluid is leaking from your vagina. °· You start to have cramping or pain in your belly (contractions). °· You have very bad pain in your lower back. °· You feel weak or pass out (faint). °· You start to throw up (vomit) after an injury. °· You have been burned. °Summary °· Some injuries that happen during pregnancy can do harm to the baby. °· Tell your doctor about any  injury. °· Take steps to avoid injury. This includes removing rugs and loose objects on the floor. Always wear your seat belt in the car. °· Do not take part in rough and violent activities or sports. °· Get help right away if you have any serious accident or injury. °This information is not intended to replace advice given to you by your health care provider. Make sure you discuss any questions you have with your health care provider. °Document Released: 02/20/2010 Document Revised: 01/28/2016 Document Reviewed: 01/28/2016 °Elsevier Patient Education © 2020 [employer] ° °

## 2018-08-31 NOTE — MAU Note (Signed)
Caught her shoe when walking, came down on knees.  Did not hit abd.  No pain. Some bleeding- was checked this morning at appt, always bleeds after exam, +FM

## 2018-09-01 ENCOUNTER — Other Ambulatory Visit: Payer: Self-pay | Admitting: Obstetrics

## 2018-09-04 ENCOUNTER — Other Ambulatory Visit: Payer: Self-pay

## 2018-09-04 ENCOUNTER — Other Ambulatory Visit (HOSPITAL_COMMUNITY)
Admission: RE | Admit: 2018-09-04 | Discharge: 2018-09-04 | Disposition: A | Discharge status: Home / Self Care | Payer: BC Managed Care – PPO | Source: Ambulatory Visit | Attending: Obstetrics | Admitting: Obstetrics

## 2018-09-04 ENCOUNTER — Other Ambulatory Visit (HOSPITAL_COMMUNITY)
Admission: RE | Admit: 2018-09-04 | Discharge: 2018-09-04 | Disposition: A | Discharge status: Home / Self Care | Payer: Self-pay | Source: Ambulatory Visit

## 2018-09-04 DIAGNOSIS — Z20828 Contact with and (suspected) exposure to other viral communicable diseases: Secondary | ICD-10-CM | POA: Insufficient documentation

## 2018-09-04 DIAGNOSIS — Z01812 Encounter for preprocedural laboratory examination: Secondary | ICD-10-CM | POA: Diagnosis not present

## 2018-09-04 LAB — SARS CORONAVIRUS 2 (TAT 6-24 HRS): SARS Coronavirus 2: NEGATIVE

## 2018-09-05 NOTE — H&P (Signed)
[name] is a 33 y.o. G1P0000 at 54w5dpresenting for IOL, post-EDC. Pt notes occasional contractions. Good fetal movement, No vaginal bleeding, not leaking fluid.  Overnight cytotec x 2, pit started in am. Now comfortable with epidural.  [place] Dated by LMP c/w 7 wk u/s - GBS pos, PCN anaphylaxis, planning Vancomycin - Asthma, not an issue through preg (also had stopped smoking with learning of preg) - Anemia, on iron - Anxiety, on Zoloft - Obesity, 44# wt gain. 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 Anemia   . Anxiety   . Asthma    chronic bronchitis  . Celiac disease   . Cervical polyp   . Endometrial polyp   . Headache   . Infection    UTI  . Kidney stones    Past Surgical History:  Procedure Laterality Date  . ADENOIDECTOMY    . DILATION AND CURETTAGE OF UTERUS  01/2017   polyp removed   . FOOT SURGERY Left   . HIP SURGERY    . TONSILLECTOMY     Family History: family history includes Alpha-1 antitrypsin deficiency in her maternal grandfather; Asthma in her mother; COPD in her maternal grandfather; Celiac disease in her brother and father; Cystic fibrosis in her sister; Diabetes in her maternal grandmother; Emphysema in her maternal grandfather; Hearing loss in her father; Heart attack (age of onset: 674 in her maternal grandfather; Heart disease in her paternal  grandfather; Hyperlipidemia in her maternal grandmother; Hyperlipidemia (age of onset: 568 in her mother; Hypertension in her paternal grandmother; Kidney disease in her maternal grandmother; Mitral valve prolapse in her mother; Obesity in her father; Other in her paternal grandmother; Skin cancer in her paternal grandfather and paternal grandmother; Thyroid disease in her mother and sister; Transient ischemic attack (age of onset: 5100 in her mother. Social History:  reports that she quit smoking about 8 months ago. Her smoking use included cigarettes. She has a 4.00 pack-year smoking history. She has never used smokeless tobacco. She reports previous alcohol use. She reports that she does not use drugs.  Review of Systems - Negative except discomfort of pregnancy  PE:  Vitals:   09/06/18 1442 09/06/18 1445 09/06/18 1446 09/06/18 1500  BP:   97/65   Pulse:   71   Resp:      Temp:      TempSrc:      SpO2: 100% 100%  100%  Weight:      Height:       Gen: well appearing, no distress Abd: obese, NT LE: NT, no edema Cvx:2/50%/ vtx -2/ med/ mid. AROM- clear FH" 120s, + accels, no decels, 10 beat var toco q  2-3  CBC    Component Value Date/Time   WBC 9.3 09/06/2018 0021   RBC 3.62 (L) 09/06/2018 0021   HGB 10.9 (L) 09/06/2018 0021   HGB 11.9 08/29/2017 1650   HCT 32.8 (L) 09/06/2018 0021   HCT 38.8 08/29/2017 1650   PLT 206 09/06/2018 0021   PLT 288 08/29/2017 1650   MCV 90.6 09/06/2018 0021   MCV 94 08/29/2017 1650   MCH 30.1 09/06/2018 0021   MCHC 33.2 09/06/2018 0021   RDW 13.8 09/06/2018 0021   RDW 13.9 08/29/2017 1650   LYMPHSABS 2.4 08/29/2017 1650   EOSABS 0.0 08/29/2017 1650   BASOSABS 0.0 08/29/2017 1650     Prenatal labs: ABO, Rh:  A pos Antibody:  neg Rubella:  immune RPR:   NR HBsAg:   neg HIV:   neg GBS:   POSITIvE 1 hr Glucola 94  Genetic screening nl NT, nl AFO Anatomy US normal   Assessment/Plan: 33 y.o. G1P0000 at [redacted]w[redacted]d- IOL at term. Plan cytotec  o/n, pitocin 2x2 in am. - GBS pos. Start Vancomycin with pitocin or with onset of active labor - Anxiety, cont Zoloft   KAla Dach8/05/2018, 11:37 PM  KAla Dach8/06/2018 3:08 PM

## 2018-09-06 ENCOUNTER — Other Ambulatory Visit: Payer: Self-pay

## 2018-09-06 ENCOUNTER — Inpatient Hospital Stay (HOSPITAL_COMMUNITY): Payer: BC Managed Care – PPO | Admitting: Anesthesiology

## 2018-09-06 ENCOUNTER — Encounter (HOSPITAL_COMMUNITY)
Admission: RE | Disposition: A | Discharge status: Home / Self Care | Payer: Self-pay | Source: Home / Self Care | Attending: Obstetrics

## 2018-09-06 ENCOUNTER — Inpatient Hospital Stay (HOSPITAL_COMMUNITY): Admission: AD | Admit: 2018-09-06 | Payer: Self-pay | Source: Home / Self Care | Admitting: Obstetrics

## 2018-09-06 ENCOUNTER — Inpatient Hospital Stay (HOSPITAL_COMMUNITY)
Admission: RE | Admit: 2018-09-06 | Discharge: 2018-09-09 | DRG: 787 | Disposition: A | Discharge status: Home / Self Care | Payer: BC Managed Care – PPO | Attending: Obstetrics | Admitting: Obstetrics

## 2018-09-06 ENCOUNTER — Inpatient Hospital Stay (HOSPITAL_COMMUNITY): Payer: Self-pay

## 2018-09-06 ENCOUNTER — Inpatient Hospital Stay (HOSPITAL_COMMUNITY): Payer: BC Managed Care – PPO

## 2018-09-06 DIAGNOSIS — O99214 Obesity complicating childbirth: Secondary | ICD-10-CM | POA: Diagnosis present

## 2018-09-06 DIAGNOSIS — O99344 Other mental disorders complicating childbirth: Secondary | ICD-10-CM | POA: Diagnosis present

## 2018-09-06 DIAGNOSIS — F419 Anxiety disorder, unspecified: Secondary | ICD-10-CM | POA: Diagnosis present

## 2018-09-06 DIAGNOSIS — Z3A40 40 weeks gestation of pregnancy: Secondary | ICD-10-CM

## 2018-09-06 DIAGNOSIS — Z87891 Personal history of nicotine dependence: Secondary | ICD-10-CM | POA: Diagnosis not present

## 2018-09-06 DIAGNOSIS — O9081 Anemia of the puerperium: Secondary | ICD-10-CM | POA: Diagnosis not present

## 2018-09-06 DIAGNOSIS — Z3490 Encounter for supervision of normal pregnancy, unspecified, unspecified trimester: Secondary | ICD-10-CM | POA: Diagnosis present

## 2018-09-06 DIAGNOSIS — O99824 Streptococcus B carrier state complicating childbirth: Secondary | ICD-10-CM | POA: Diagnosis present

## 2018-09-06 DIAGNOSIS — O26893 Other specified pregnancy related conditions, third trimester: Secondary | ICD-10-CM | POA: Diagnosis present

## 2018-09-06 DIAGNOSIS — L259 Unspecified contact dermatitis, unspecified cause: Secondary | ICD-10-CM | POA: Diagnosis not present

## 2018-09-06 DIAGNOSIS — D62 Acute posthemorrhagic anemia: Secondary | ICD-10-CM | POA: Diagnosis not present

## 2018-09-06 DIAGNOSIS — O9902 Anemia complicating childbirth: Secondary | ICD-10-CM | POA: Diagnosis present

## 2018-09-06 DIAGNOSIS — O9972 Diseases of the skin and subcutaneous tissue complicating childbirth: Secondary | ICD-10-CM | POA: Diagnosis not present

## 2018-09-06 LAB — CBC
HCT: 32.8 % — ABNORMAL LOW (ref 36.0–46.0)
Hemoglobin: 10.9 g/dL — ABNORMAL LOW (ref 12.0–15.0)
MCH: 30.1 pg (ref 26.0–34.0)
MCHC: 33.2 g/dL (ref 30.0–36.0)
MCV: 90.6 fL (ref 80.0–100.0)
Platelets: 206 10*3/uL (ref 150–400)
RBC: 3.62 MIL/uL — ABNORMAL LOW (ref 3.87–5.11)
RDW: 13.8 % (ref 11.5–15.5)
WBC: 9.3 10*3/uL (ref 4.0–10.5)
nRBC: 0 % (ref 0.0–0.2)

## 2018-09-06 LAB — ABO/RH: ABO/RH(D): A POS

## 2018-09-06 LAB — TYPE AND SCREEN
ABO/RH(D): A POS
Antibody Screen: NEGATIVE

## 2018-09-06 LAB — RPR: RPR Ser Ql: NONREACTIVE

## 2018-09-06 SURGERY — Surgical Case
Anesthesia: Epidural

## 2018-09-06 MED ORDER — KETOROLAC TROMETHAMINE 30 MG/ML IJ SOLN
INTRAMUSCULAR | Status: AC
  Filled 2018-09-06: qty 1

## 2018-09-06 MED ORDER — MISOPROSTOL 25 MCG QUARTER TABLET
25.0000 ug | ORAL_TABLET | ORAL | Status: AC | PRN
  Administered 2018-09-06 (×2): 25 ug via VAGINAL
  Filled 2018-09-06 (×2): qty 1

## 2018-09-06 MED ORDER — NALBUPHINE HCL 10 MG/ML IJ SOLN: 5.0000 mg | Freq: Once | INTRAMUSCULAR | Status: DC | PRN

## 2018-09-06 MED ORDER — LACTATED RINGERS IV SOLN
INTRAVENOUS | Status: DC
  Administered 2018-09-06 (×3): via INTRAVENOUS

## 2018-09-06 MED ORDER — KETOROLAC TROMETHAMINE 30 MG/ML IJ SOLN: 30.0000 mg | Freq: Four times a day (QID) | INTRAMUSCULAR | Status: AC | PRN

## 2018-09-06 MED ORDER — PHENYLEPHRINE HCL (PRESSORS) 10 MG/ML IV SOLN
INTRAVENOUS | Status: DC | PRN
  Administered 2018-09-06 (×3): 80 ug via INTRAVENOUS

## 2018-09-06 MED ORDER — ONDANSETRON HCL 4 MG/2ML IJ SOLN: 4.0000 mg | Freq: Four times a day (QID) | INTRAMUSCULAR | Status: DC | PRN

## 2018-09-06 MED ORDER — ONDANSETRON HCL 4 MG/2ML IJ SOLN
INTRAMUSCULAR | Status: AC
  Filled 2018-09-06: qty 2

## 2018-09-06 MED ORDER — PHENYLEPHRINE 40 MCG/ML (10ML) SYRINGE FOR IV PUSH (FOR BLOOD PRESSURE SUPPORT)
PREFILLED_SYRINGE | INTRAVENOUS | Status: AC
  Administered 2018-09-06: 80 ug
  Administered 2018-09-06: 80 ug via INTRAVENOUS
  Filled 2018-09-06: qty 10

## 2018-09-06 MED ORDER — MEPERIDINE HCL 25 MG/ML IJ SOLN: 6.2500 mg | INTRAMUSCULAR | Status: DC | PRN

## 2018-09-06 MED ORDER — DEXAMETHASONE SODIUM PHOSPHATE 10 MG/ML IJ SOLN
INTRAMUSCULAR | Status: DC | PRN
  Administered 2018-09-06: 10 mg via INTRAVENOUS

## 2018-09-06 MED ORDER — FENTANYL CITRATE (PF) 100 MCG/2ML IJ SOLN: 25.0000 ug | INTRAMUSCULAR | Status: DC | PRN

## 2018-09-06 MED ORDER — ACETAMINOPHEN 325 MG PO TABS: 650.0000 mg | ORAL_TABLET | ORAL | Status: DC | PRN

## 2018-09-06 MED ORDER — MORPHINE SULFATE (PF) 0.5 MG/ML IJ SOLN
INTRAMUSCULAR | Status: AC
  Filled 2018-09-06: qty 10

## 2018-09-06 MED ORDER — KETOROLAC TROMETHAMINE 30 MG/ML IJ SOLN
30.0000 mg | Freq: Four times a day (QID) | INTRAMUSCULAR | Status: AC | PRN
  Administered 2018-09-06: 30 mg via INTRAMUSCULAR

## 2018-09-06 MED ORDER — SODIUM CHLORIDE 0.9 % IV SOLN
INTRAVENOUS | Status: DC | PRN
  Administered 2018-09-06: 22:00:00 via INTRAVENOUS

## 2018-09-06 MED ORDER — OXYTOCIN 40 UNITS IN NORMAL SALINE INFUSION - SIMPLE MED
INTRAVENOUS | Status: AC
  Filled 2018-09-06: qty 1000

## 2018-09-06 MED ORDER — DIPHENHYDRAMINE HCL 50 MG/ML IJ SOLN: 12.5000 mg | INTRAMUSCULAR | Status: DC | PRN

## 2018-09-06 MED ORDER — OXYTOCIN 40 UNITS IN NORMAL SALINE INFUSION - SIMPLE MED
1.0000 m[IU]/min | INTRAVENOUS | Status: DC
  Administered 2018-09-06: 09:00:00 2 m[IU]/min via INTRAVENOUS
  Filled 2018-09-06: qty 1000

## 2018-09-06 MED ORDER — TERBUTALINE SULFATE 1 MG/ML IJ SOLN
0.2500 mg | Freq: Once | INTRAMUSCULAR | Status: DC | PRN
  Filled 2018-09-06: qty 1

## 2018-09-06 MED ORDER — OXYTOCIN BOLUS FROM INFUSION: 500.0000 mL | Freq: Once | INTRAVENOUS | Status: DC

## 2018-09-06 MED ORDER — SOD CITRATE-CITRIC ACID 500-334 MG/5ML PO SOLN
30.0000 mL | ORAL | Status: DC | PRN
  Administered 2018-09-06: 30 mL via ORAL
  Filled 2018-09-06: qty 30

## 2018-09-06 MED ORDER — ONDANSETRON HCL 4 MG/2ML IJ SOLN: 4.0000 mg | Freq: Three times a day (TID) | INTRAMUSCULAR | Status: DC | PRN

## 2018-09-06 MED ORDER — LACTATED RINGERS IV SOLN: 500.0000 mL | Freq: Once | INTRAVENOUS | Status: DC

## 2018-09-06 MED ORDER — SODIUM CHLORIDE 0.9 % IV SOLN
INTRAVENOUS | Status: DC | PRN
  Administered 2018-09-06: 22:00:00 40 [IU] via INTRAVENOUS

## 2018-09-06 MED ORDER — SODIUM CHLORIDE 0.9% FLUSH: 3.0000 mL | INTRAVENOUS | Status: DC | PRN

## 2018-09-06 MED ORDER — DEXAMETHASONE SODIUM PHOSPHATE 10 MG/ML IJ SOLN
INTRAMUSCULAR | Status: AC
  Filled 2018-09-06: qty 1

## 2018-09-06 MED ORDER — NALBUPHINE HCL 10 MG/ML IJ SOLN: 5.0000 mg | INTRAMUSCULAR | Status: DC | PRN

## 2018-09-06 MED ORDER — SODIUM BICARBONATE 8.4 % IV SOLN
INTRAVENOUS | Status: DC | PRN
  Administered 2018-09-06 (×2): 5 mL via EPIDURAL

## 2018-09-06 MED ORDER — EPHEDRINE 5 MG/ML INJ: 10.0000 mg | INTRAVENOUS | Status: DC | PRN

## 2018-09-06 MED ORDER — FENTANYL-BUPIVACAINE-NACL 0.5-0.125-0.9 MG/250ML-% EP SOLN: 12.0000 mL/h | EPIDURAL | Status: DC | PRN

## 2018-09-06 MED ORDER — SODIUM CHLORIDE 0.9 % IR SOLN
Status: DC | PRN
  Administered 2018-09-06: 1

## 2018-09-06 MED ORDER — PHENYLEPHRINE 40 MCG/ML (10ML) SYRINGE FOR IV PUSH (FOR BLOOD PRESSURE SUPPORT)
PREFILLED_SYRINGE | INTRAVENOUS | Status: AC
  Filled 2018-09-06: qty 10

## 2018-09-06 MED ORDER — VANCOMYCIN HCL IN DEXTROSE 1-5 GM/200ML-% IV SOLN
1000.0000 mg | Freq: Two times a day (BID) | INTRAVENOUS | Status: DC
  Administered 2018-09-06 (×2): 1000 mg via INTRAVENOUS
  Filled 2018-09-06 (×2): qty 200

## 2018-09-06 MED ORDER — MORPHINE SULFATE (PF) 0.5 MG/ML IJ SOLN
INTRAMUSCULAR | Status: DC | PRN
  Administered 2018-09-06: 3 mg via EPIDURAL

## 2018-09-06 MED ORDER — SODIUM CHLORIDE (PF) 0.9 % IJ SOLN
INTRAMUSCULAR | Status: DC | PRN
  Administered 2018-09-06: 12 mL/h via EPIDURAL

## 2018-09-06 MED ORDER — NALOXONE HCL 0.4 MG/ML IJ SOLN: 0.4000 mg | INTRAMUSCULAR | Status: DC | PRN

## 2018-09-06 MED ORDER — PHENYLEPHRINE 40 MCG/ML (10ML) SYRINGE FOR IV PUSH (FOR BLOOD PRESSURE SUPPORT): 80.0000 ug | PREFILLED_SYRINGE | INTRAVENOUS | Status: DC | PRN

## 2018-09-06 MED ORDER — GENTAMICIN SULFATE 40 MG/ML IJ SOLN
1.5000 mg/kg | Freq: Once | INTRAVENOUS | Status: AC
  Administered 2018-09-06: 21:00:00 469.2 mg via INTRAVENOUS
  Filled 2018-09-06: qty 3.5

## 2018-09-06 MED ORDER — LIDOCAINE HCL (PF) 1 % IJ SOLN: 30.0000 mL | INTRAMUSCULAR | Status: DC | PRN

## 2018-09-06 MED ORDER — NALOXONE HCL 4 MG/10ML IJ SOLN
1.0000 ug/kg/h | INTRAVENOUS | Status: DC | PRN
  Filled 2018-09-06: qty 5

## 2018-09-06 MED ORDER — LACTATED RINGERS IV SOLN: 500.0000 mL | INTRAVENOUS | Status: DC | PRN

## 2018-09-06 MED ORDER — FENTANYL-BUPIVACAINE-NACL 0.5-0.125-0.9 MG/250ML-% EP SOLN
EPIDURAL | Status: AC
  Filled 2018-09-06: qty 250

## 2018-09-06 MED ORDER — OXYTOCIN 40 UNITS IN NORMAL SALINE INFUSION - SIMPLE MED: 2.5000 [IU]/h | INTRAVENOUS | Status: DC

## 2018-09-06 MED ORDER — LIDOCAINE 2% (20 MG/ML) 5 ML SYRINGE
INTRAMUSCULAR | Status: AC
  Filled 2018-09-06: qty 5

## 2018-09-06 MED ORDER — DIPHENHYDRAMINE HCL 25 MG PO CAPS: 25.0000 mg | ORAL_CAPSULE | ORAL | Status: DC | PRN

## 2018-09-06 MED ORDER — ONDANSETRON HCL 4 MG/2ML IJ SOLN
INTRAMUSCULAR | Status: DC | PRN
  Administered 2018-09-06: 4 mg via INTRAVENOUS

## 2018-09-06 MED ORDER — LIDOCAINE HCL (PF) 1 % IJ SOLN
INTRAMUSCULAR | Status: DC | PRN
  Administered 2018-09-06: 8 mL via EPIDURAL
  Administered 2018-09-06: 4 mL via EPIDURAL

## 2018-09-06 SURGICAL SUPPLY — 35 items
BENZOIN TINCTURE PRP APPL 2/3 (GAUZE/BANDAGES/DRESSINGS) ×2 IMPLANT
CHLORAPREP W/TINT 26ML (MISCELLANEOUS) ×2 IMPLANT
CLAMP CORD UMBIL (MISCELLANEOUS) IMPLANT
CLOTH BEACON ORANGE TIMEOUT ST (SAFETY) ×2 IMPLANT
DRSG OPSITE POSTOP 4X10 (GAUZE/BANDAGES/DRESSINGS) ×2 IMPLANT
ELECT REM PT RETURN 9FT ADLT (ELECTROSURGICAL) ×2
ELECTRODE REM PT RTRN 9FT ADLT (ELECTROSURGICAL) ×1 IMPLANT
EXTRACTOR VACUUM M CUP 4 TUBE (SUCTIONS) IMPLANT
GLOVE BIO SURGEON STRL SZ 6.5 (GLOVE) ×2 IMPLANT
GLOVE BIOGEL PI IND STRL 7.0 (GLOVE) ×2 IMPLANT
GLOVE BIOGEL PI INDICATOR 7.0 (GLOVE) ×2
GOWN STRL REUS W/TWL LRG LVL3 (GOWN DISPOSABLE) ×4 IMPLANT
KIT ABG SYR 3ML LUER SLIP (SYRINGE) IMPLANT
NEEDLE HYPO 22GX1.5 SAFETY (NEEDLE) IMPLANT
NEEDLE HYPO 25X5/8 SAFETYGLIDE (NEEDLE) IMPLANT
NS IRRIG 1000ML POUR BTL (IV SOLUTION) ×2 IMPLANT
PACK C SECTION WH (CUSTOM PROCEDURE TRAY) ×2 IMPLANT
PAD ABD 7.5X8 STRL (GAUZE/BANDAGES/DRESSINGS) ×2 IMPLANT
PAD OB MATERNITY 4.3X12.25 (PERSONAL CARE ITEMS) ×2 IMPLANT
PENCIL SMOKE EVAC W/HOLSTER (ELECTROSURGICAL) ×2 IMPLANT
SPONGE GAUZE 4X4 12PLY STER LF (GAUZE/BANDAGES/DRESSINGS) ×4 IMPLANT
STRIP CLOSURE SKIN 1/2X4 (GAUZE/BANDAGES/DRESSINGS) ×2 IMPLANT
SUT MON AB 4-0 PS1 27 (SUTURE) ×2 IMPLANT
SUT PLAIN 0 NONE (SUTURE) IMPLANT
SUT PLAIN 2 0 XLH (SUTURE) ×2 IMPLANT
SUT VIC AB 0 CT1 36 (SUTURE) ×4 IMPLANT
SUT VIC AB 0 CTX 36 (SUTURE) ×3
SUT VIC AB 0 CTX36XBRD ANBCTRL (SUTURE) ×3 IMPLANT
SUT VIC AB 2-0 CT1 27 (SUTURE) ×2
SUT VIC AB 2-0 CT1 TAPERPNT 27 (SUTURE) ×2 IMPLANT
SUT VIC AB 4-0 KS 27 (SUTURE) ×2 IMPLANT
SYR CONTROL 10ML LL (SYRINGE) IMPLANT
TOWEL OR 17X24 6PK STRL BLUE (TOWEL DISPOSABLE) ×2 IMPLANT
TRAY FOLEY W/BAG SLVR 14FR LF (SET/KITS/TRAYS/PACK) IMPLANT
WATER STERILE IRR 1000ML POUR (IV SOLUTION) ×4 IMPLANT

## 2018-09-06 NOTE — Anesthesia Preprocedure Evaluation (Addendum)
Anesthesia Evaluation  Patient identified by MRN, date of birth, ID band Patient awake    Reviewed: Allergy & Precautions, H&P , NPO status , Patient's Chart, lab work & pertinent test results  Airway Mallampati: II  TM Distance: >3 FB Neck ROM: full    Dental no notable dental hx. (+) Teeth Intact   Pulmonary former smoker,    Pulmonary exam normal breath sounds clear to auscultation       Cardiovascular negative cardio ROS Normal cardiovascular exam Rhythm:regular Rate:Normal     Neuro/Psych PSYCHIATRIC DISORDERS Anxiety    GI/Hepatic Neg liver ROS,   Endo/Other  Morbid obesity  Renal/GU   negative genitourinary   Musculoskeletal negative musculoskeletal ROS (+)   Abdominal (+) + obese,   Peds  Hematology  (+) Blood dyscrasia, anemia ,   Anesthesia Other Findings   Reproductive/Obstetrics (+) Pregnancy                             Anesthesia Physical Anesthesia Plan  ASA: III and emergent  Anesthesia Plan: Epidural   Post-op Pain Management:    Induction:   PONV Risk Score and Plan: 4 or greater and Scopolamine patch - Pre-op, Ondansetron and Treatment may vary due to age or medical condition  Airway Management Planned: Natural Airway  Additional Equipment:   Intra-op Plan:   Post-operative Plan:   Informed Consent: I have reviewed the patients History and Physical, chart, labs and discussed the procedure including the risks, benefits and alternatives for the proposed anesthesia with the patient or authorized representative who has indicated his/her understanding and acceptance.     Dental advisory given  Plan Discussed with: CRNA and Surgeon  Anesthesia Plan Comments: (Urgent C/Section for failure to progress and prolonged deceleration. Will use epidural for C/Section. M. Royce Macadamia, MD)       Anesthesia Quick Evaluation

## 2018-09-06 NOTE — Brief Op Note (Signed)
09/06/2018  10:37 PM  PATIENT:  [name]  33 y.o. female  PRE-OPERATIVE DIAGNOSIS:  Primary unscheduled cesarean section; nonreassuring fetal tracing  POST-OPERATIVE DIAGNOSIS:  Primary unscheduled cesarean section; nonreassuring fetal tracing; nuchal cord x2, occult cord prolapse  PROCEDURE:  Procedure(s): CESAREAN SECTION (N/A)  Low-transverse cesarean section with 2 layer closure  SURGEON:  Surgeon(s) and Role:    Aloha Gell, MD - Primary  PHYSICIAN ASSISTANT:   ASSISTANTS:tanya  Mel Almond, CNM  ANESTHESIA:   epidural  EBL:  308 mL   BLOOD ADMINISTERED:none  DRAINS: Urinary Catheter (Foley)   LOCAL MEDICATIONS USED:  NONE  SPECIMEN:  Source of Specimen:  Placenta  DISPOSITION OF SPECIMEN:  Labor and delivery  COUNTS:  YES  TOURNIQUET:  * No tourniquets in log *  DICTATION: .Note written in EPIC  PLAN OF CARE: Admit to inpatient   PATIENT DISPOSITION:  PACU - hemodynamically stable.   Delay start of Pharmacological VTE agent (>24hrs) due to surgical blood loss or risk of bleeding: yes

## 2018-09-06 NOTE — Progress Notes (Signed)
Patient comfortable with epidural.  Another 7-minute deceleration and Pitocin discontinued.  Recovery of fetal heart tones to 120s but minimal variability, no decelerations Cervix 3 to 4 cm 80%, -2, increased bleeding noted  Assessment and plan: Fetal intolerance to labor.  Recommend proceeding to primary cesarean section.  Risk benefits discussed with patient husband who agreed to proceed.  Penicillin anaphylaxis.  GBS positive has been on vancomycin.  Will complete vancomycin and add gent and Clinda for preoperative antibiotic prophylaxis  Ala Dach 09/06/2018 8:58 PM

## 2018-09-06 NOTE — Anesthesia Procedure Notes (Signed)
Epidural Patient location during procedure: OB Start time: 09/06/2018 12:40 PM End time: 09/06/2018 12:44 PM  Staffing Anesthesiologist: Lyn Hollingshead, MD Performed: anesthesiologist   Preanesthetic Checklist Completed: patient identified, site marked, surgical consent, pre-op evaluation, timeout performed, IV checked, risks and benefits discussed and monitors and equipment checked  Epidural Patient position: sitting Prep: site prepped and draped and DuraPrep Patient monitoring: continuous pulse ox and blood pressure Approach: midline Location: L3-L4 Injection technique: LOR air  Needle:  Needle type: Tuohy  Needle gauge: 17 G Needle length: 9 cm and 9 Needle insertion depth: 7 cm Catheter type: closed end flexible Catheter size: 19 Gauge Catheter at skin depth: 12 cm Test dose: negative and Other  Assessment Events: blood not aspirated, injection not painful, no injection resistance, negative IV test and no paresthesia  Additional Notes Reason for block:procedure for pain

## 2018-09-06 NOTE — Transfer of Care (Signed)
Immediate Anesthesia Transfer of Care Note  Patient: [name]  Procedure(s) Performed: CESAREAN SECTION (N/A )  Patient Location: PACU  Anesthesia Type:Epidural  Level of Consciousness: awake  Airway & Oxygen Therapy: Patient Spontanous Breathing  Post-op Assessment: Report given to RN and Post -op Vital signs reviewed and stable  Post vital signs: Reviewed and stable  Last Vitals:  Vitals Value Taken Time  BP 109/63 09/06/18 2235  Temp 37 C 09/06/18 2235  Pulse 80 09/06/18 2237  Resp 18 09/06/18 2237  SpO2 100 % 09/06/18 2237  Vitals shown include unvalidated device data.  Last Pain:  Vitals:   09/06/18 2001  TempSrc:   PainSc: 0-No pain         Complications: No apparent anesthesia complications

## 2018-09-06 NOTE — Op Note (Signed)
09/06/2018  10:37 PM  PATIENT:  [name]  33 y.o. female  PRE-OPERATIVE DIAGNOSIS:  Primary unscheduled cesarean section; nonreassuring fetal tracing  POST-OPERATIVE DIAGNOSIS:  Primary unscheduled cesarean section; nonreassuring fetal tracing; nuchal cord x2, occult cord prolapse  PROCEDURE:  Procedure(s): CESAREAN SECTION (N/A)  Low-transverse cesarean section with 2 layer closure  SURGEON:  Surgeon(s) and Role:    Aloha Gell, MD - Primary  PHYSICIAN ASSISTANT:   ASSISTANTS:tanya  Mel Almond, CNM  ANESTHESIA:   epidural  EBL:  308 mL   BLOOD ADMINISTERED:none  DRAINS: Urinary Catheter (Foley)   LOCAL MEDICATIONS USED:  NONE  SPECIMEN:  Source of Specimen:  Placenta  DISPOSITION OF SPECIMEN:  Labor and delivery  COUNTS:  YES  TOURNIQUET:  * No tourniquets in log *  DICTATION: .Note written in EPIC  PLAN OF CARE: Admit to inpatient   PATIENT DISPOSITION:  PACU - hemodynamically stable.   Delay start of Pharmacological VTE agent (>24hrs) due to surgical blood loss or risk of bleeding: yes     Findings:  @BABYSEXEBC @ infant,  APGAR (1 MIN):   APGAR (5 MINS):   APGAR (10 MINS):   Normal uterus, tubes and ovaries, normal placenta. 3VC, clear amniotic fluid Female, loose double nuchal cord with occult cord prolapse  EBL: Per nursing notes  Antibiotics: Gentamicin plus clindamycin Complications: none  Indications: This is a 33 y.o. year-old, G1 at 37w5dadmitted [facility]. Risks benefits and alternatives of the procedure were discussed with the patient who agreed to proceed  Procedure:  After informed consent was obtained the patient was taken to the operating room where epidural anesthesia was found to be adequate.  She was prepped and draped in the normal sterile fashion in dorsal supine position with a leftward tilt.  A foley catheter was in place.  A Pfannenstiel skin incision was made 2 cm above the pubic symphysis in the  midline with the scalpel.  Dissection was carried down with the Bovie cautery until the fascia was reached. The fascia was incised in the midline. The incision was extended laterally with the Mayo scissors. The inferior aspect of the fascial incision was grasped with the Coker clamps, elevated up and the underlying rectus muscles were dissected off sharply. The superior aspect of the fascial incision was grasped with the Coker clamps elevated up and the underlying rectus muscles were dissected off sharply.  The peritoneum was entered bluntly. The peritoneal incision was extended superiorly and inferiorly with good visualization of the bladder. The bladder blade was inserted and palpation was done to assess the fetal position and the location of the uterine vessels. The lower segment of the uterus was incised sharply with the scalpel and extended  bluntly in the cephalo-caudal fashion. The infant was grasped, brought to the incision,  rotated and the infant was delivered with fundal pressure. The nose and mouth were bulb suctioned. The cord was clamped and cut after 1 minute delay. The infant was handed off to the waiting pediatrician. The placenta was expressed. The uterus was exteriorized. The uterus was cleared of all clots and debris. The uterine incision was repaired with 0 Vicryl in a running locked fashion.  A second layer of the same suture was used in an imbricating fashion to obtain excellent hemostasis.  2 additional figure-of-eight sutures were placed in the midpoint of the uterus.  the uterus was then returned to the abdomen, the gutters were cleared of all clots and debris. The  uterine incision was reinspected and found to be hemostatic. The peritoneum was grasped and closed with 2-0 Vicryl in a running fashion. The cut muscle edges and the underside of the fascia were inspected and found to be hemostatic. The fascia was closed with 0 Vicryl in 2 halves. The subcutaneous tissue was irrigated. Scarpa's  layer was closed with a 2-0 plain gut suture. The skin was closed with a 4-0 Monocryl in a single layer. The patient tolerated the procedure well. Sponge lap and needle counts were correct x3 and patient was taken to the recovery room in a stable condition.  Ala Dach 09/06/2018 10:39 PM

## 2018-09-06 NOTE — Progress Notes (Signed)
S: Doing well, no complaints, pain well controlled with epidural  O: BP 119/73   Pulse 60   Temp 98 F (36.7 C) (Oral)   Resp 16   Ht 5' 10"  (1.778 m)   Wt 131.8 kg   SpO2 100%   BMI 41.68 kg/m    FHT:  FHR: 120s bpm, variability: moderate,  accelerations:  Present,  decelerations:  Present none currently but was having deep variables, some late in timing, pitocin stopped x 1 hr and decelerations resolved UC:   regular, every 3 minutes SVE:   Dilation: 3 Effacement (%): 80 Station: -2 Exam by:: Dr. Pamala Hurry   A / P:  33 y.o.  OB History  Gravida Para Term Preterm AB Living  1 0 0 0 0 0  SAB TAB Ectopic Multiple Live Births  0 0 0 0 0   at 58w5dIOL at post-EDC, slow progress, pitocin needed to be stopped to due decels, will restart now and evaluated closely.   Fetal Wellbeing:  Category I Pain Control:  Epidural  Anticipated MOD:  unclear  KAla Dach8/06/2018, 7:34 PM

## 2018-09-07 ENCOUNTER — Encounter (HOSPITAL_COMMUNITY): Payer: Self-pay

## 2018-09-07 LAB — CBC
HCT: 29.5 % — ABNORMAL LOW (ref 36.0–46.0)
Hemoglobin: 9.9 g/dL — ABNORMAL LOW (ref 12.0–15.0)
MCH: 30.9 pg (ref 26.0–34.0)
MCHC: 33.6 g/dL (ref 30.0–36.0)
MCV: 92.2 fL (ref 80.0–100.0)
Platelets: 179 10*3/uL (ref 150–400)
RBC: 3.2 MIL/uL — ABNORMAL LOW (ref 3.87–5.11)
RDW: 13.6 % (ref 11.5–15.5)
WBC: 14.1 10*3/uL — ABNORMAL HIGH (ref 4.0–10.5)
nRBC: 0 % (ref 0.0–0.2)

## 2018-09-07 MED ORDER — DIPHENHYDRAMINE HCL 25 MG PO CAPS: 25.0000 mg | ORAL_CAPSULE | Freq: Four times a day (QID) | ORAL | Status: DC | PRN

## 2018-09-07 MED ORDER — LACTATED RINGERS IV SOLN
INTRAVENOUS | Status: DC
  Administered 2018-09-07 (×2): via INTRAVENOUS

## 2018-09-07 MED ORDER — HYDROCODONE-ACETAMINOPHEN 5-325 MG PO TABS: 1.0000 | ORAL_TABLET | ORAL | Status: DC | PRN

## 2018-09-07 MED ORDER — SIMETHICONE 80 MG PO CHEW: 80.0000 mg | CHEWABLE_TABLET | ORAL | Status: DC | PRN

## 2018-09-07 MED ORDER — FAMOTIDINE 20 MG PO TABS
20.0000 mg | ORAL_TABLET | Freq: Two times a day (BID) | ORAL | Status: DC
  Administered 2018-09-08 (×2): 20 mg via ORAL
  Filled 2018-09-07 (×5): qty 1

## 2018-09-07 MED ORDER — WITCH HAZEL-GLYCERIN EX PADS: 1.0000 "application " | MEDICATED_PAD | CUTANEOUS | Status: DC | PRN

## 2018-09-07 MED ORDER — IBUPROFEN 800 MG PO TABS
800.0000 mg | ORAL_TABLET | Freq: Three times a day (TID) | ORAL | Status: DC
  Administered 2018-09-07 – 2018-09-09 (×7): 800 mg via ORAL
  Filled 2018-09-07 (×7): qty 1

## 2018-09-07 MED ORDER — SERTRALINE HCL 50 MG PO TABS
50.0000 mg | ORAL_TABLET | Freq: Every day | ORAL | Status: DC
  Administered 2018-09-08: 50 mg via ORAL
  Filled 2018-09-07 (×3): qty 1

## 2018-09-07 MED ORDER — MENTHOL 3 MG MT LOZG: 1.0000 | LOZENGE | OROMUCOSAL | Status: DC | PRN

## 2018-09-07 MED ORDER — ZOLPIDEM TARTRATE 5 MG PO TABS: 5.0000 mg | ORAL_TABLET | Freq: Every evening | ORAL | Status: DC | PRN

## 2018-09-07 MED ORDER — DIBUCAINE (PERIANAL) 1 % EX OINT: 1.0000 "application " | TOPICAL_OINTMENT | CUTANEOUS | Status: DC | PRN

## 2018-09-07 MED ORDER — SIMETHICONE 80 MG PO CHEW
80.0000 mg | CHEWABLE_TABLET | ORAL | Status: DC
  Administered 2018-09-08 (×2): 80 mg via ORAL
  Filled 2018-09-07 (×2): qty 1

## 2018-09-07 MED ORDER — OXYTOCIN 40 UNITS IN NORMAL SALINE INFUSION - SIMPLE MED: 2.5000 [IU]/h | INTRAVENOUS | Status: AC

## 2018-09-07 MED ORDER — PRENATAL MULTIVITAMIN CH
1.0000 | ORAL_TABLET | Freq: Every day | ORAL | Status: DC
  Administered 2018-09-07: 1 via ORAL
  Filled 2018-09-07 (×3): qty 1

## 2018-09-07 MED ORDER — SENNOSIDES-DOCUSATE SODIUM 8.6-50 MG PO TABS
2.0000 | ORAL_TABLET | ORAL | Status: DC
  Administered 2018-09-08 (×2): 2 via ORAL
  Filled 2018-09-07 (×2): qty 2

## 2018-09-07 MED ORDER — SIMETHICONE 80 MG PO CHEW
80.0000 mg | CHEWABLE_TABLET | Freq: Three times a day (TID) | ORAL | Status: DC
  Administered 2018-09-07 – 2018-09-09 (×6): 80 mg via ORAL
  Filled 2018-09-07 (×5): qty 1

## 2018-09-07 MED ORDER — TETANUS-DIPHTH-ACELL PERTUSSIS 5-2.5-18.5 LF-MCG/0.5 IM SUSP: 0.5000 mL | Freq: Once | INTRAMUSCULAR | Status: DC

## 2018-09-07 NOTE — Anesthesia Postprocedure Evaluation (Signed)
Anesthesia Post Note  Patient: [name]  Procedure(s) Performed: CESAREAN SECTION (N/A )     Patient location during evaluation: PACU Anesthesia Type: Epidural Level of consciousness: oriented and awake and alert Pain management: pain level controlled Vital Signs Assessment: post-procedure vital signs reviewed and stable Respiratory status: spontaneous breathing, respiratory function stable and nonlabored ventilation Cardiovascular status: blood pressure returned to baseline and stable Postop Assessment: no headache, no backache, no apparent nausea or vomiting, epidural receding and patient able to bend at knees Anesthetic complications: no    Last Vitals:  Vitals:   09/06/18 2330 09/06/18 2349  BP: 98/77 (!) 103/50  Pulse: 71 72  Resp: 17 13  Temp: 36.9 C   SpO2: 99% 98%    Last Pain:  Vitals:   09/06/18 2349  TempSrc:   PainSc: 0-No pain   Pain Goal:                Epidural/Spinal Function Cutaneous sensation: Normal sensation (09/06/18 2355), Patient able to flex knees: Yes (09/06/18 2355), Patient able to lift hips off bed: Yes (09/06/18 2355), Back pain beyond tenderness at insertion site: No (09/06/18 2355), Progressively worsening motor and/or sensory loss: No (09/06/18 2355), Bowel and/or bladder incontinence post epidural: No (09/06/18 2355)  Michalene Debruler A.

## 2018-09-07 NOTE — Progress Notes (Signed)
MOB was referred for history of anxiety.  * Referral screened out by Clinical Social Worker because none of the following criteria appear to apply:  ~ History of anxiety/depression during this pregnancy, or of post-partum depression following prior delivery. ~ Diagnosis of anxiety and/or depression within last 3 years OR * MOB's symptoms currently being treated with medication and/or therapy. MOB currently prescribed Zoloft.  Please contact the Clinical Social Worker if needs arise, by [facility] request, or if MOB scores greater than 9/yes to question 10 on Edinburgh Postpartum Depression Screen.  Elijio Miles, [place]  [facility] [employer] [phone]

## 2018-09-07 NOTE — Lactation Note (Signed)
This note was copied from a baby's chart. Lactation Consultation Note Baby 4 hrs old.  Mom trying to latch when [place] entered rm. Mom has flat nipples, wide space tubular breast.  Hand expressed colostrum. [place] bruising noted to Lt. Nipple.  Nipples not compressible enough to obtain deep latch. Mom has hand pump and brought her personal DEBP. Encouraged pre-pumping to evert nipple before placing NS.  Shells given.  Fitted #20 NS. #24 at bedside.  Application demonstrated. Baby fussy, hungry, suckling on hands. Taught "C" hold for latching. Placed baby in football hold. Baby finally latched and fed well. Popped off a couple of times searching for breast. Stressed firming breast when latching so baby can feel NS in mouth. Cheeks to breast. Newborn feeding habits, STS, I&O, positioning, support, breast massage, supply and demand. Mom encouraged to feed baby 8-12 times/24 hours and with feeding cues. Mom encouraged to waken baby for feeds if hasn't cued in 3 hrs. Encouraged to call for assistance. Lactation brochure given.  Patient Name: [name] [id number]'W Date: 09/07/2018 Reason for consult: Initial assessment;Term;Primapara   Maternal Data Has patient been taught Hand Expression?: Yes Does the patient have breastfeeding experience prior to this delivery?: No  Feeding Feeding Type: Breast Fed  LATCH Score Latch: Repeated attempts needed to sustain latch, nipple held in mouth throughout feeding, stimulation needed to elicit sucking reflex.  Audible Swallowing: A few with stimulation  Type of Nipple: Flat  Comfort (Breast/Nipple): Filling, red/small blisters or bruises, mild/mod discomfort(bruising)  Hold (Positioning): Assistance needed to correctly position infant at breast and maintain latch.  LATCH Score: 5  Interventions Interventions: Breast feeding basics reviewed;Adjust position;Assisted with latch;Support pillows;Skin to skin;Position options;Breast  massage;Hand express;Pre-pump if needed;Breast compression;Shells  Lactation Tools Discussed/Used Tools: Pump;Shells;Nipple Shields Nipple shield size: 20;24 Shell Type: Inverted WIC Program: No   Consult Status Consult Status: Follow-up Date: 09/08/18 Follow-up type: In-patient    [name], [name] 09/07/2018, 2:35 AM

## 2018-09-07 NOTE — Progress Notes (Signed)
POSTOPERATIVE DAY # 1 S/P Primary LTCS for non-reassuring fetal tracing, baby boy   S:         Reports feeling good, minimal discomfort and pain controlled with Motrin              Tolerating po intake / no nausea / no vomiting / + flatus / no BM  Denies dizziness, SOB, or CP             Bleeding is heavy             Up ad lib / ambulatory/ foley catheter removal at 11:30am - no void yet  Newborn breast feeding - going well per mom  / Circumcision - planning prior to d/c   O:  VS: BP (!) 105/93 (BP Location: Right Arm)   Pulse 81   Temp 98.6 F (37 C)   Resp 18   Ht 5' 10"  (1.778 m)   Wt 131.8 kg   SpO2 99%   Breastfeeding Unknown   BMI 41.68 kg/m  09/07/18 1145  98.4 F (36.9 C)  -  -  20  -  -  98 %  -  - PG   09/07/18 0430  98.6 F (37 C)  81  -  18  105/93Abnormal   Semi-fowlers  99 %  -  - JL   09/07/18 0324  98.4 F (36.9 C)  73  -  18  105/53Abnormal   Semi-fowlers  96 %  -  - JL   09/07/18 0220  98.4 F (36.9 C)  80  -  18  110/57Abnormal   Semi-fowlers  98 %  -  - JL   09/07/18 0115  98.8 F (37.1 C)  77  -  18  115/66  Semi-fowlers  95 %  -  - JL   09/07/18 0020  99.3 F (37.4 C)  89  -  16  109/77  -  99 %  -  - JL   09/06/18 2349  -  72  72  13  103/50Abnormal   -  98 %  -  - VT   09/06/18 2330  98.4 F (36.9 C)  71  71  17  98/77  -  99 %  Room Air  - VT   09/06/18 2315  -  76  75  13  116/77  -  100 %  Room Air  - VT   09/06/18 2302  98.3 F (36.8 C)  -  -  -  114/73  -  -  Room Air  - VT   09/06/18 2245  -  77  77  23Abnormal   104/66  -  100 %  Room Air  - VT   09/06/18 2235  98.6 F (37 C)  -  -  -  109/63  -  -  Room Air  - VT      LABS:               Recent Labs    09/06/18 0021 09/07/18 0453  WBC 9.3 14.1*  HGB 10.9* 9.9*  PLT 206 179               Bloodtype: --/--/A POS, A POS Performed at [facility], [place] [address], [place], [place] [address]  [phone] 0021)  Rubella:  I&O:  Intake/Output      08/05 0701 - 08/06 0700 08/06 0701 - 08/07 0700   I.V. (mL/kg) 1700 (12.9)    Total Intake(mL/kg) 1700 (12.9)    Urine (mL/kg/hr) 950 (0.3)    Blood 308    Total Output 1258    Net +442                      Physical Exam:             Alert and Oriented X3  Lungs: Clear and unlabored  Heart: regular rate and rhythm / no murmurs  Abdomen: soft, non-tender, obese, mild gaseous distention, active bowel sounds in all quadrants             Fundus: firm, non-tender, U-1             Dressing: pressure dressing in place, but able to visualize under dressing; honeycomb with steri-strips small amount of drainage noted              Incision:  approximated with sutures / no erythema / no ecchymosis / no drainage  Perineum: intact  Lochia: small amount on pad   Extremities: trace LE edema, no calf pain or tenderness,   A:        POD # 1 S/P Primary LTCS            ABL Anemia compounding chronic IDA  Anxiety - stable on Zoloft 75m  P:        Routine postoperative care              Niferex 1532mPO daily tomorrow  Magnesium oxide 40044maily   Continue lactation support  May remove pressure dressing in shower   Okay to shower today   Ambulation encouraged  Continue current care   MerLars PinksSN, CNM [facility]

## 2018-09-07 NOTE — Addendum Note (Signed)
Addendum  created 09/07/18 0020 by Purvis Kilts, CRNA   Intraprocedure Flowsheets edited

## 2018-09-07 NOTE — Anesthesia Postprocedure Evaluation (Signed)
Anesthesia Post Note  Patient: [name]  Procedure(s) Performed: CESAREAN SECTION (N/A )     Patient location during evaluation: Mother Baby Anesthesia Type: Epidural Level of consciousness: awake and alert Pain management: pain level controlled Vital Signs Assessment: post-procedure vital signs reviewed and stable Respiratory status: spontaneous breathing, nonlabored ventilation and respiratory function stable Cardiovascular status: stable Postop Assessment: no headache, no backache and epidural receding Anesthetic complications: no Comments: Spoke with pt on the phone. No anesthetic complications noted    Last Vitals:  Vitals:   09/07/18 0324 09/07/18 0430  BP: (!) 105/53 (!) 105/93  Pulse: 73 81  Resp: 18 18  Temp: 36.9 C 37 C  SpO2: 96% 99%    Last Pain:  Vitals:   09/07/18 0544  TempSrc:   PainSc: 0-No pain   Pain Goal:                   Riki Sheer

## 2018-09-07 NOTE — Addendum Note (Signed)
Addendum  created 09/07/18 0804 by Riki Sheer, CRNA   Clinical Note Signed

## 2018-09-08 DIAGNOSIS — O9902 Anemia complicating childbirth: Secondary | ICD-10-CM | POA: Diagnosis present

## 2018-09-08 DIAGNOSIS — F419 Anxiety disorder, unspecified: Secondary | ICD-10-CM | POA: Diagnosis present

## 2018-09-08 MED ORDER — HYDROCORTISONE 1 % EX CREA
TOPICAL_CREAM | Freq: Three times a day (TID) | CUTANEOUS | Status: DC
  Administered 2018-09-08 – 2018-09-09 (×3): via TOPICAL
  Filled 2018-09-08: qty 28

## 2018-09-08 NOTE — Lactation Note (Signed)
This note was copied from a baby's chart. Lactation Consultation Note Baby 83 hrs old. Cluster feeding. Baby was circumcised today. Mom stated BF well before circumcision, then slept. Now is cluster feeding. Mom has short shaft nipples, needing NS d/t not compressible enough to obtain deep latch. Mom latched well in football position. Gave rolled up blankets to place under mom's hand for support while feeding. Feeding looked good, heard occasional swallows. Colostrum noted in NS per mom.  Patient Name: [name] [id number] Date: 09/08/2018 Reason for consult: Follow-up assessment;Primapara   Maternal Data Has patient been taught Hand Expression?: Yes Does the patient have breastfeeding experience prior to this delivery?: No  Feeding Feeding Type: Breast Fed  LATCH Score Latch: Grasps breast easily, tongue down, lips flanged, rhythmical sucking.  Audible Swallowing: A few with stimulation  Type of Nipple: Flat  Comfort (Breast/Nipple): Filling, red/small blisters or bruises, mild/mod discomfort  Hold (Positioning): No assistance needed to correctly position infant at breast.  LATCH Score: 7  Interventions Interventions: Skin to skin;Breast compression;Adjust position  Lactation Tools Discussed/Used     Consult Status Consult Status: Follow-up Date: 09/09/18 Follow-up type: In-patient    [name], [name] 09/08/2018, 1:28 AM

## 2018-09-08 NOTE — Progress Notes (Signed)
Patient ID: [name], female   DOB: 1985-09-06, 33 y.o.   MRN: [id number] Subjective: S/P Primary LTCS for non-reassuring fetal tracing, baby boy POD# 2 Live born female  Birth Weight: 7 lb 4.2 oz (3295 g) APGAR: 8, 9  Newborn Delivery   Birth date/time: 09/06/2018 21:41:00 Delivery type: C-Section, Low Transverse Trial of labor: Yes C-section categorization: Primary      Delivering provider: Aloha Gell   circumcision completed Feeding: breast  Pain control at delivery: Epidural   Reports feeling some burning above incision line, noted redness where the pressure dressing tape touched the skin.  Patient reports tolerating PO.   Breast symptoms: using NS Pain controlled with PO meds Denies HA/SOB/C/P/N/V/dizziness. Flatus present. She reports vaginal bleeding as normal, without clots.  She is ambulating, urinating without difficulty.     Objective:   VS:    Vitals:   09/07/18 1145 09/07/18 1645 09/07/18 1930 09/08/18 0500  BP:  102/61 (!) 101/57 96/70  Pulse:  74 74 64  Resp: 20 18 18 18   Temp: 98.4 F (36.9 C) 98.4 F (36.9 C) 97.7 F (36.5 C) 97.6 F (36.4 C)  TempSrc: Oral Oral Oral Oral  SpO2: 98% 97%  98%  Weight:      Height:          Intake/Output Summary (Last 24 hours) at 09/08/2018 0912 Last data filed at 09/07/2018 2000 Gross per 24 hour  Intake 1233.26 ml  Output 1475 ml  Net -241.74 ml        Recent Labs    09/06/18 0021 09/07/18 0453  WBC 9.3 14.1*  HGB 10.9* 9.9*  HCT 32.8* 29.5*  PLT 206 179     Blood type: --/--/A POS, A POS Performed at [facility] [address], [place], [place] [address]  (08/05 0021)  Rubella:   immune   Physical Exam:  General: alert, cooperative and no distress Abdomen: soft, nontender, normal bowel sounds, erythema above incision line, linear in shape of dressing tape. Incision: clean, dry and intact Uterine Fundus: firm, below umbilicus, nontender Lochia: minimal Ext: edema trace, no  cords/tenderness      Assessment/Plan: 33 y.o.   POD# 2. G1P1001                  Principal Problem:   Postpartum care following cesarean delivery (8/5) Active Problems:   Encounter for planned induction of labor   Cesarean delivery   Maternal anemia, with delivery  - started oral Fe and Mag Ox   Anxiety during pregnancy  - stable on Zoloft 50 mg daily Contact dermatitis  - hydrocortisone cream to area TID Doing well, stable.               Advance diet as tolerated Encourage rest when baby rests Breastfeeding support Encourage to ambulate Routine post-op care  Juliene Pina, CNM, MSN 09/08/2018, 9:12 AM

## 2018-09-09 MED ORDER — HYDROCORTISONE 1 % EX CREA: TOPICAL_CREAM | Freq: Three times a day (TID) | CUTANEOUS | 0 refills | Status: DC

## 2018-09-09 MED ORDER — SIMETHICONE 80 MG PO CHEW: 80.0000 mg | CHEWABLE_TABLET | ORAL | 0 refills | Status: DC | PRN

## 2018-09-09 MED ORDER — IBUPROFEN 800 MG PO TABS: 800.0000 mg | ORAL_TABLET | Freq: Three times a day (TID) | ORAL | 0 refills | Status: DC

## 2018-09-09 MED ORDER — HYDROCODONE-ACETAMINOPHEN 5-325 MG PO TABS: 1.0000 | ORAL_TABLET | ORAL | 0 refills | Status: DC | PRN

## 2018-09-09 MED ORDER — HYDROCODONE-ACETAMINOPHEN 5-325 MG PO TABS: 1.0000 | ORAL_TABLET | Freq: Four times a day (QID) | ORAL | 0 refills | Status: AC | PRN

## 2018-09-09 NOTE — Lactation Note (Signed)
This note was copied from a baby's chart. Lactation Consultation Note  Patient Name: [name] [id number] Date: 09/09/2018 Reason for consult: 1st time breastfeeding;Term;Mother's request P1, 58 hour female infant, currently cluster feeding , weight loss -7%, C/S delivery. Mom with hx of: C/S delivery, anxiety and on Zoloft. Per mom, want LC assess latch mom has been using 20 mm NS and wants to latch infant to breast without NS. LC notice mom has nipple stripe on her  left breast. LC asked mom hand express a little colostrum prior to latching infant to breast. Mom latched infant on left breast using football hold, LC asked mom tickle infant with nipple below nose, wait until mouth is wide with tongue down, mom brought infant close to breast chin first with nose and chin touching breast.  Infant latched and few swallows observed, infant was still breastfeeding when Fidelis left room and infant was breastfeeding for 8 minutes. Per mom, she did not feel any pain with latch and she could tell infant had a deeper latch.  Mom has coconut oil for sore nipples with abrasion and mom knows how to apply coconut oil to breast.  Mom knows to call Nurse or [place] if she has any more questions, concerns or need assistance with latching infant to breast.  Mom knows to breastfeed according hunger cues, 8 to 12 times and on demand.   Maternal Data    Feeding    LATCH Score Latch: Grasps breast easily, tongue down, lips flanged, rhythmical sucking.  Audible Swallowing: A few with stimulation  Type of Nipple: Everted at rest and after stimulation  Comfort (Breast/Nipple): Filling, red/small blisters or bruises, mild/mod discomfort  Hold (Positioning): Assistance needed to correctly position infant at breast and maintain latch.  LATCH Score: 7  Interventions Interventions: Position options;Support pillows;Adjust position;Breast compression;Hand express;Skin to skin;Assisted with latch  Lactation Tools  Discussed/Used     Consult Status Consult Status: Follow-up Date: 09/09/18 Follow-up type: In-patient    Vicente Serene 09/09/2018, 5:13 AM

## 2018-09-09 NOTE — Discharge Summary (Addendum)
OB Discharge Summary  Patient Name: [name] DOB: [birth year] MRN: [id number]  Date of admission: 09/06/2018 Delivering provider: Aloha Gell   Date of discharge: 09/09/2018  Admitting diagnosis: Preg Intrauterine pregnancy: [redacted]w[redacted]d    Secondary diagnosis:Principal Problem:   Postpartum care following cesarean delivery (8/5) Active Problems:   Encounter for planned induction of labor   Cesarean delivery   Maternal anemia, with delivery   Anxiety during pregnancy  Additional problems:none     Discharge diagnosis:  Patient Active Problem List   Diagnosis Date Noted  . Maternal anemia, with delivery 09/08/2018  . Anxiety during pregnancy 09/08/2018  . Encounter for planned induction of labor 09/06/2018  . Postpartum care following cesarean delivery (8/5) 09/06/2018  . Cesarean delivery 09/06/2018  . History of food allergy 08/29/2017  . Seasonal and perennial allergic rhinitis 08/29/2017  . Allergic conjunctivitis 08/29/2017  . 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 Mild intermittent asthma 08/29/2017  . Cigarette smoker 10/31/2016  . Family history of genetic disorder 10/15/2016  . Mild persistent chronic asthma without complication 178/29/5621 . Pulmonary hypertension ([facility] 12/23/2014  . Obesity (BMI 30-39.9) 12/23/2014  . GERD (gastroesophageal reflux disease) 12/23/2014                                                                Post partum procedures:none  Augmentation: Pitocin and Cytotec Pain control: Epidural  Laceration:None  Episiotomy:None  Complications: None   Hospital course:  Induction of Labor With Cesarean Section  33y.o. yo G1P1001 at 464w5das admitted to the hospital 09/06/2018 for induction of labor. Patient had a labor course significant for non-reassuring fetal heart tracing. The patient went for cesarean section due to  Non-Reassuring FHR, and delivered a Viable infant,09/06/2018  Membrane Rupture Time/Date: 2:44 PM ,09/06/2018   Details of operation can be found in separate operative Note.  Patient had an uncomplicated postpartum course. She is ambulating, tolerating a regular diet, passing flatus, and urinating well.  Patient is discharged home in stable condition on 09/09/18.                                    Physical exam  Vitals:   09/08/18 0500 09/08/18 1421 09/08/18 2347 09/09/18 0514  BP: 96/70 110/80 103/66 105/69  Pulse: 64 65 74 64  Resp: 18 17 18 18   Temp: 97.6 F (36.4 C) 97.9 F (36.6 C) 98.1 F (36.7 C) 98 F (36.7 C)  TempSrc: Oral Oral Oral Oral  SpO2: 98% 100% 94% 98%  Weight:      Height:       General: alert, cooperative and no distress Lochia: appropriate Uterine Fundus: firm Incision: No significant erythema, Dressing is clean, dry, and intact DVT Evaluation: No cords or calf tenderness. No significant calf/ankle edema. Labs: Lab Results  Component Value Date   WBC 14.1 (H) 09/07/2018   HGB 9.9 (L) 09/07/2018   HCT 29.5 (L) 09/07/2018   MCV 92.2 09/07/2018   PLT 179 09/07/2018   CMP Latest Ref Rng & Units 08/31/2016  Glucose 65 - 99 mg/dL 79  BUN 6 - 20 mg/dL  19  Creatinine 0.57 - 1.00 mg/dL 0.71  Sodium 134 - 144 mmol/L 138  Potassium 3.5 - 5.2 mmol/L 4.9  Chloride 96 - 106 mmol/L 102  CO2 20 - 29 mmol/L 20  Calcium 8.7 - 10.2 mg/dL 9.1  Total Protein 6.0 - 8.5 g/dL 6.6  Total Bilirubin 0.0 - 1.2 mg/dL 0.4  Alkaline Phos 39 - 117 IU/L 52  AST 0 - 40 IU/L 17  ALT 0 - 32 IU/L 19     Discharge instruction: per After Visit Summary and "Baby and Me Booklet".  After Visit Meds:  Allergies as of 09/09/2018      Reactions   Avelox [moxifloxacin] Shortness Of Breath   Cephalosporins Shortness Of Breath   CP Other reaction(s): Chest pain   Food Anaphylaxis   "pine nuts"   Penicillins Anaphylaxis   Sulfa Antibiotics Shortness Of Breath   Coconut Oil    Biaxin  [clarithromycin] Nausea And Vomiting      Medication List    TAKE these medications   EPINEPHrine 0.3 mg/0.3 mL Soaj injection Commonly known as: EPI-PEN Inject 0.3 mg into the muscle as needed for anaphylaxis.   famotidine 20 MG tablet Commonly known as: PEPCID Take 20 mg by mouth 2 (two) times daily.   HYDROcodone-acetaminophen 5-325 MG tablet Commonly known as: NORCO/VICODIN Take 1-2 tablets by mouth every 6 (six) hours as needed for moderate pain.   hydrocortisone cream 1 % Apply topically 3 (three) times daily.   ibuprofen 800 MG tablet Commonly known as: ADVIL Take 1 tablet (800 mg total) by mouth every 8 (eight) hours.   Magnesium 400 MG Caps Take 1 tablet by mouth daily.   PRENATAL VITAMIN PO Take 1 tablet by mouth daily.   ProAir RespiClick 812 (90 Base) MCG/ACT Aepb Generic drug: Albuterol Sulfate Inhale 2 puffs into the lungs every 4 (four) hours as needed.   sertraline 50 MG tablet Commonly known as: ZOLOFT Take 50 mg by mouth daily.   simethicone 80 MG chewable tablet Commonly known as: MYLICON Chew 1 tablet (80 mg total) by mouth as needed for flatulence.   Vitamin D3 50 MCG (2000 UT) capsule Take 2,000 Units by mouth daily.            Discharge Care Instructions  (From admission, onward)       Remove dressing in 48 hours, remove steristrips after additional 48 hrs.  Diet: routine diet  Activity: Advance as tolerated. Pelvic rest for 6 weeks.   Postpartum contraception: Not Discussed  Newborn Data: Live born female Theo Birth Weight: 7 lb 4.2 oz (3295 g) APGAR: 8, 9  Newborn Delivery   Birth date/time: 09/06/2018 21:41:00 Delivery type: C-Section, Low Transverse Trial of labor: Yes C-section categorization: Primary      Baby Feeding: Breast Disposition:home with mother   Delivery Report:  Review the Delivery Report for details.    Follow up: Follow-up Information    Obgyn, [facility]. Schedule an appointment as soon as  possible for a visit in 6 week(s).   Contact information: [address] [place] [address] [phone]             Signed: Otilio Carpen, MSN 09/09/2018, 9:35 AM

## 2018-09-09 NOTE — Lactation Note (Signed)
This note was copied from a baby's chart. Lactation Consultation Note  Patient Name: [name] [id number] Date: 09/09/2018   Baby 10 hours old and latched upon entering in football hold w/ intermittent swallows.  8% weight loss. Mother states her soreness is improving.  Mother has positional stripes.  For soreness suggest mother apply ebm or coconut oil while wearing shells and alternate with comfort gels. Discussed compressing breasts during feeding and feeding on both breasts per feeding. Mother has DEBP at home.  Discussed pumping for going back to work. Reviewed engorgement care and monitoring voids/stools. Feed on demand approximately 8-12 times per day.   Wake baby if needed.  Mother states stools are transitioning.      Maternal Data    Feeding Feeding Type: Breast Fed  LATCH Score Latch: Repeated attempts needed to sustain latch, nipple held in mouth throughout feeding, stimulation needed to elicit sucking reflex.  Audible Swallowing: A few with stimulation  Type of Nipple: Everted at rest and after stimulation  Comfort (Breast/Nipple): Filling, red/small blisters or bruises, mild/mod discomfort  Hold (Positioning): Assistance needed to correctly position infant at breast and maintain latch.  LATCH Score: 6  Interventions    Lactation Tools Discussed/Used     Consult Status      Vivianne Master [facility] 09/09/2018, 10:59 AM

## 2019-01-23 ENCOUNTER — Ambulatory Visit: Payer: BC Managed Care – PPO | Attending: Internal Medicine

## 2019-01-23 DIAGNOSIS — Z20822 Contact with and (suspected) exposure to covid-19: Secondary | ICD-10-CM

## 2019-01-24 LAB — NOVEL CORONAVIRUS, NAA: SARS-CoV-2, NAA: NOT DETECTED

## 2019-03-14 ENCOUNTER — Other Ambulatory Visit: Payer: Self-pay

## 2019-03-14 ENCOUNTER — Ambulatory Visit: Payer: BC Managed Care – PPO | Attending: Internal Medicine

## 2019-03-14 DIAGNOSIS — Z20822 Contact with and (suspected) exposure to covid-19: Secondary | ICD-10-CM

## 2019-03-15 LAB — NOVEL CORONAVIRUS, NAA: SARS-CoV-2, NAA: NOT DETECTED

## 2019-03-29 ENCOUNTER — Ambulatory Visit: Payer: BC Managed Care – PPO | Attending: Family

## 2019-03-29 DIAGNOSIS — Z23 Encounter for immunization: Secondary | ICD-10-CM

## 2019-03-29 NOTE — Progress Notes (Signed)
   Covid-19 Vaccination Clinic  Name:  [name]    MRN: [id number] DOB: 07-Feb-1985  03/29/2019  [name] was observed post Covid-19 immunization for 15 minutes without incidence. She was provided with Vaccine Information Sheet and instruction to access the V-Safe system.   [name] was instructed to call 911 with any severe reactions post vaccine: Marland Kitchen Difficulty breathing  . Swelling of your face and throat  . A fast heartbeat  . A bad rash all over your body  . Dizziness and weakness    Immunizations Administered    Name Date Dose VIS Date Route   Moderna COVID-19 Vaccine 03/29/2019  3:22 PM 0.5 mL 01/02/2019 Intramuscular   Manufacturer: Moderna   Lot: [id number]   [place]: [id number]

## 2019-05-01 ENCOUNTER — Ambulatory Visit: Payer: BC Managed Care – PPO | Attending: Family

## 2019-05-01 DIAGNOSIS — Z23 Encounter for immunization: Secondary | ICD-10-CM

## 2019-05-01 NOTE — Progress Notes (Signed)
   Covid-19 Vaccination Clinic  Name:  [name]    MRN: [id number] DOB: [birth year]  05/01/2019  [name] was observed post Covid-19 immunization for 30 minutes based on pre-vaccination screening without incident. She was provided with Vaccine Information Sheet and instruction to access the V-Safe system.   [name] was instructed to call 911 with any severe reactions post vaccine: Marland Kitchen Difficulty breathing  . Swelling of face and throat  . A fast heartbeat  . A bad rash all over body  . Dizziness and weakness   Immunizations Administered    Name Date Dose VIS Date Route   Moderna COVID-19 Vaccine 05/01/2019  3:17 PM 0.5 mL 01/02/2019 Intramuscular   Manufacturer: Moderna   Lot: [id number]   [place]: [phone]

## 2019-06-30 IMAGING — DX DG CHEST 2V
2 series · 2 of 2 positions shown · non-contrast
Comparison: Chest CT November 26, 2014

CLINICAL DATA: Cough and congestion with chest tightness

EXAM:
CHEST  2 VIEW

[chest pa]
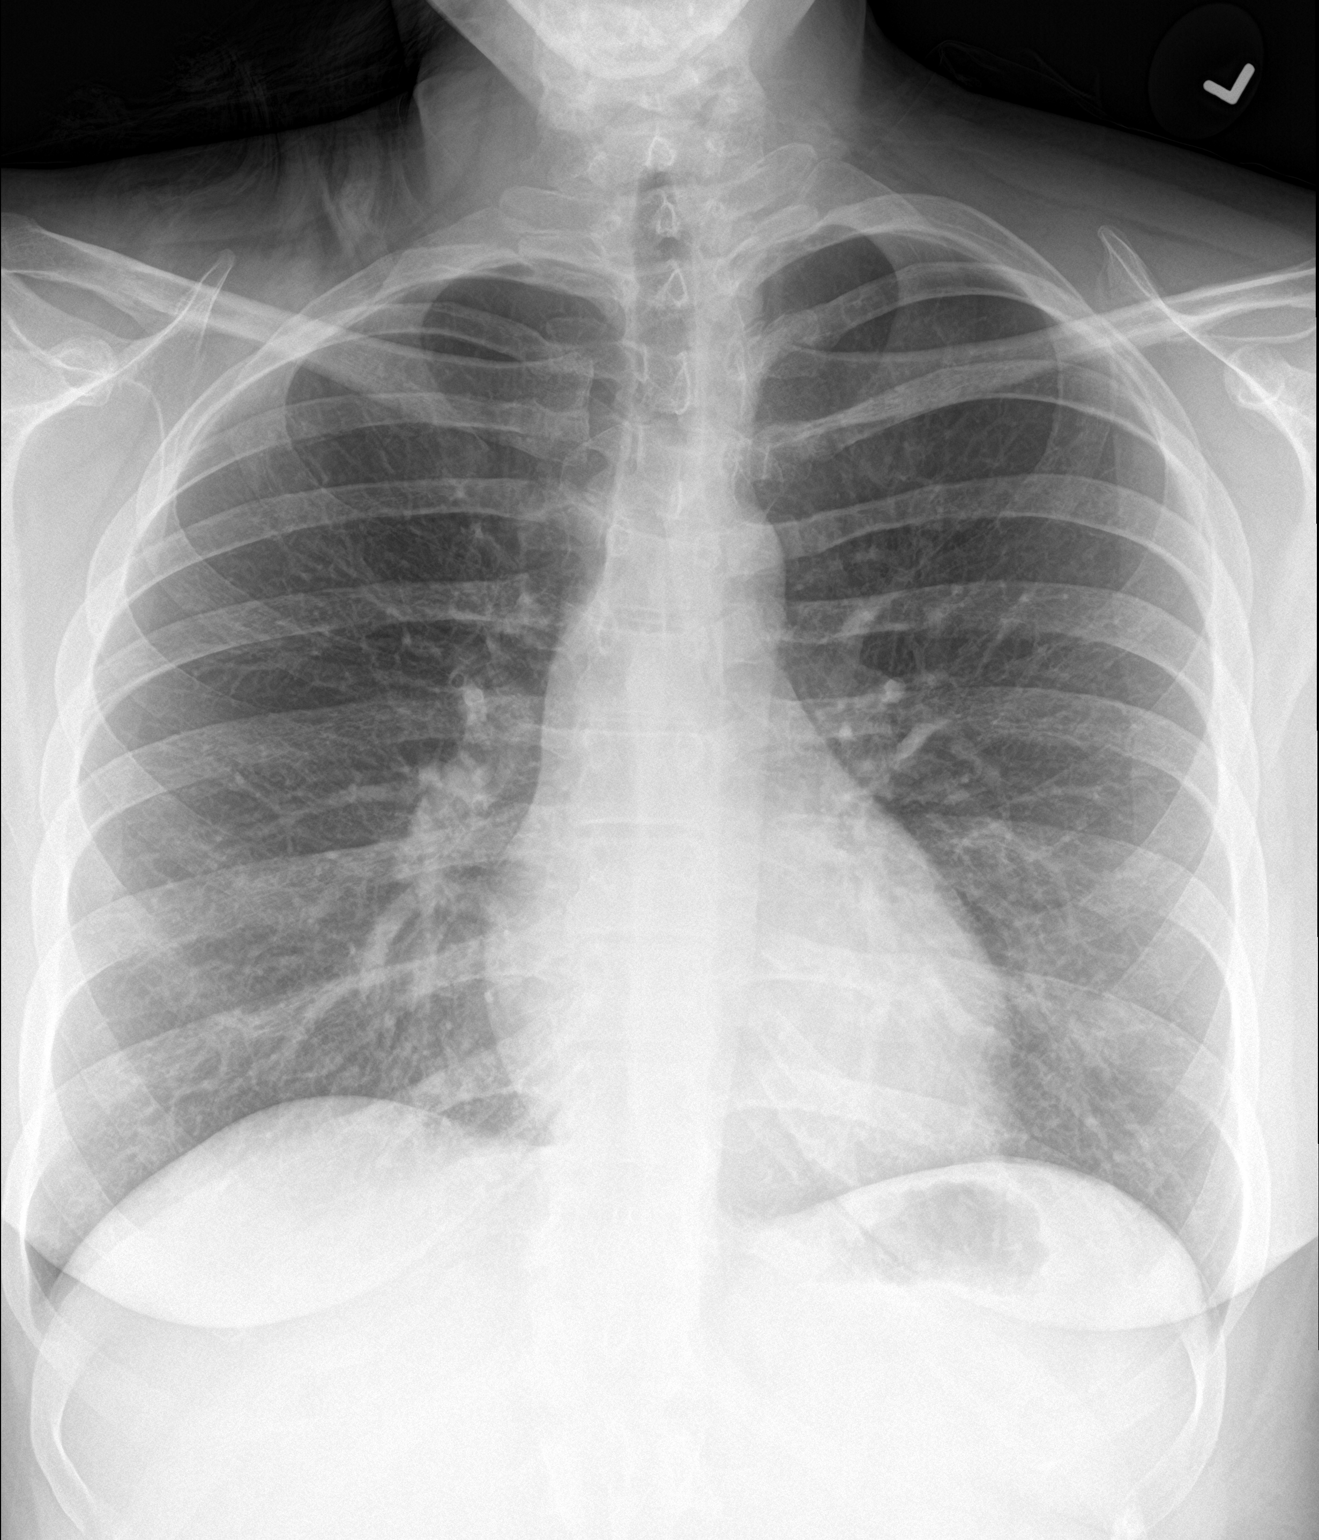

[chest lat]
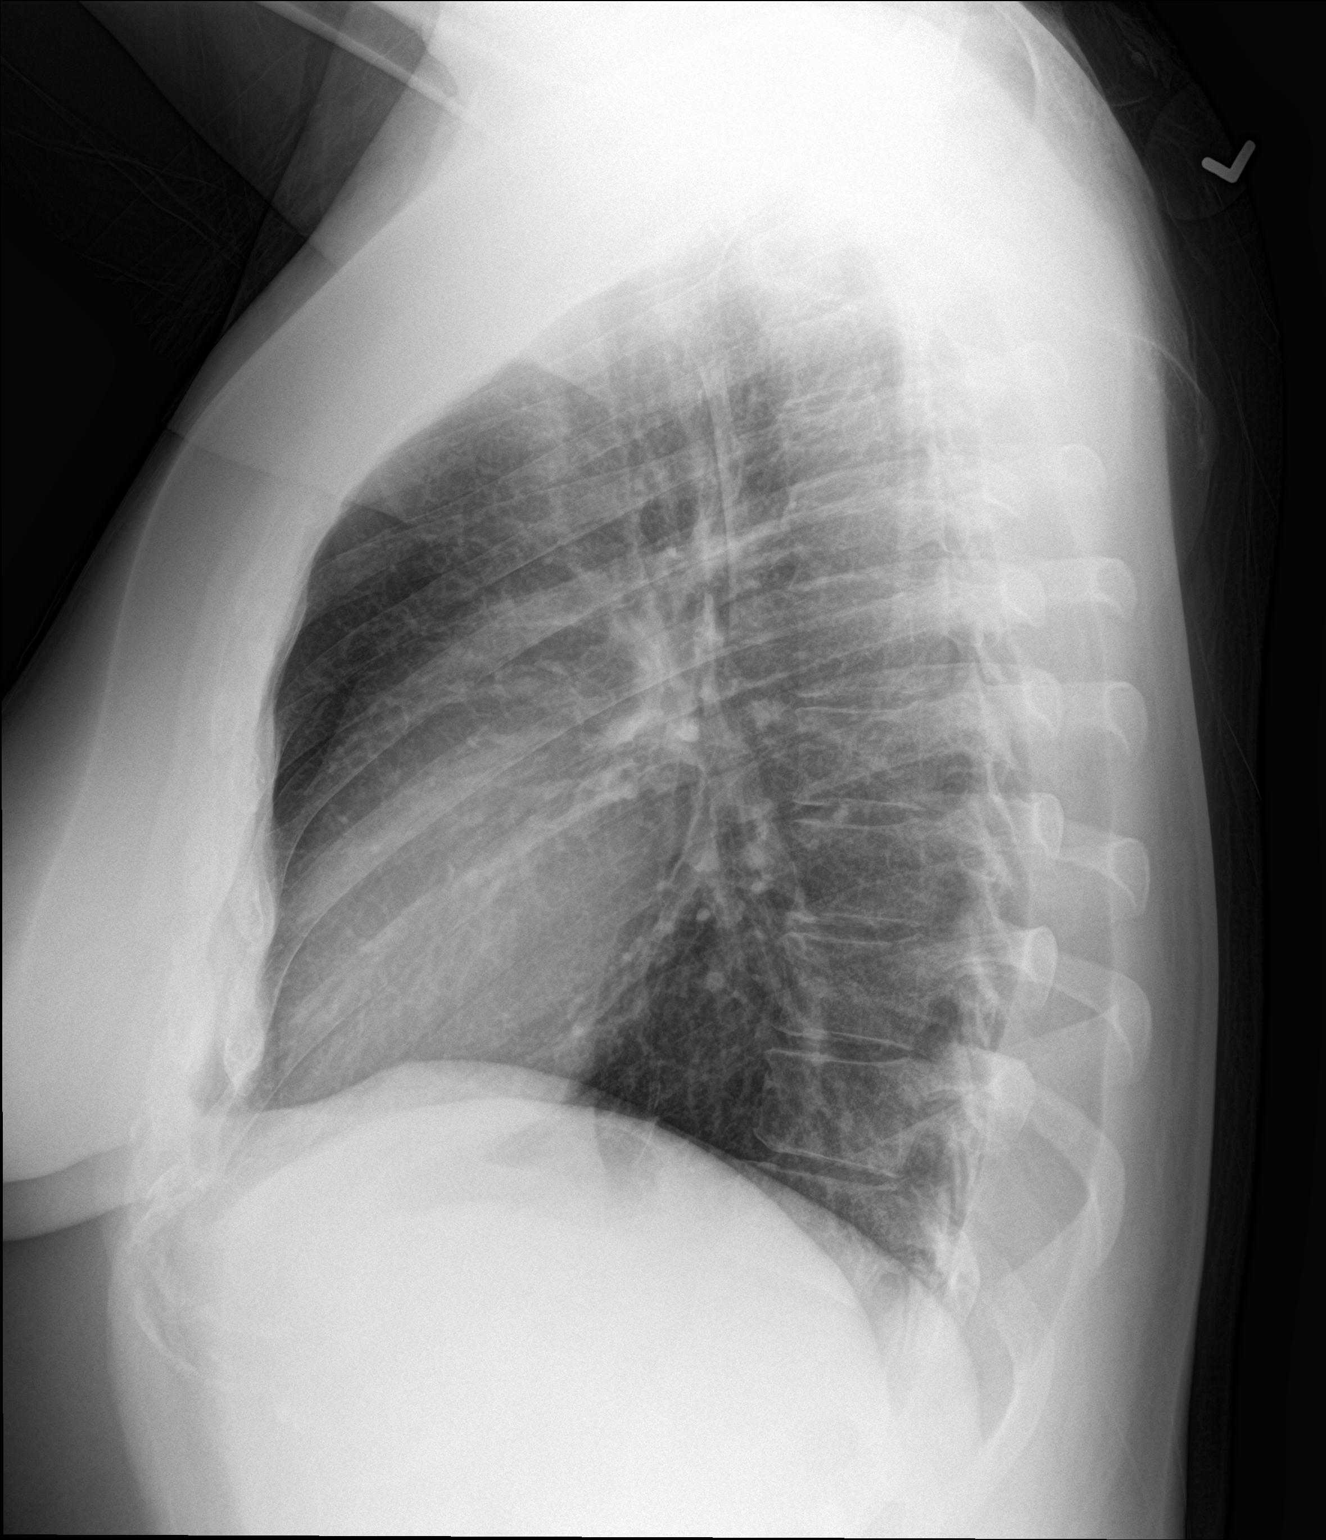

[2 of 2 positions shown; findings below may reference images not displayed]

FINDINGS: Lungs are clear. Heart size and pulmonary vascularity are normal. No
pneumothorax. No adenopathy. No bone lesions.
IMPRESSION: No edema or consolidation.

## 2019-10-15 ENCOUNTER — Other Ambulatory Visit: Payer: Self-pay | Admitting: Family Medicine

## 2019-10-15 DIAGNOSIS — R1011 Right upper quadrant pain: Secondary | ICD-10-CM

## 2019-10-19 ENCOUNTER — Other Ambulatory Visit: Payer: BC Managed Care – PPO

## 2019-10-24 ENCOUNTER — Other Ambulatory Visit: Payer: BC Managed Care – PPO

## 2019-11-01 ENCOUNTER — Ambulatory Visit
Admission: RE | Admit: 2019-11-01 | Discharge: 2019-11-01 | Disposition: A | Discharge status: Home / Self Care | Payer: BC Managed Care – PPO | Source: Ambulatory Visit | Attending: Family Medicine | Admitting: Family Medicine

## 2019-11-01 DIAGNOSIS — R1011 Right upper quadrant pain: Secondary | ICD-10-CM

## 2019-12-13 ENCOUNTER — Ambulatory Visit: Payer: BC Managed Care – PPO | Attending: Internal Medicine

## 2019-12-13 DIAGNOSIS — Z23 Encounter for immunization: Secondary | ICD-10-CM

## 2019-12-13 NOTE — Progress Notes (Signed)
   Covid-19 Vaccination Clinic  Name:  [name]    MRN: [id number] DOB: [birth year]  12/13/2019  [name] was observed post Covid-19 immunization for 30 minutes based on pre-vaccination screening without incident. She was provided with Vaccine Information Sheet and instruction to access the V-Safe system.   [name] was instructed to call 911 with any severe reactions post vaccine: Marland Kitchen Difficulty breathing  . Swelling of face and throat  . A fast heartbeat  . A bad rash all over body  . Dizziness and weakness

## 2020-03-11 ENCOUNTER — Encounter (HOSPITAL_COMMUNITY): Payer: Self-pay

## 2020-03-11 ENCOUNTER — Emergency Department (HOSPITAL_COMMUNITY)
Admission: EM | Admit: 2020-03-11 | Discharge: 2020-03-11 | Disposition: A | Discharge status: Home / Self Care | Payer: BC Managed Care – PPO | Attending: Emergency Medicine | Admitting: Emergency Medicine

## 2020-03-11 ENCOUNTER — Other Ambulatory Visit: Payer: Self-pay

## 2020-03-11 DIAGNOSIS — Z87891 Personal history of nicotine dependence: Secondary | ICD-10-CM | POA: Diagnosis not present

## 2020-03-11 DIAGNOSIS — X58XXXA Exposure to other specified factors, initial encounter: Secondary | ICD-10-CM | POA: Diagnosis not present

## 2020-03-11 DIAGNOSIS — T43205A Adverse effect of unspecified antidepressants, initial encounter: Secondary | ICD-10-CM | POA: Diagnosis not present

## 2020-03-11 DIAGNOSIS — Z8616 Personal history of COVID-19: Secondary | ICD-10-CM | POA: Diagnosis not present

## 2020-03-11 DIAGNOSIS — J4520 Mild intermittent asthma, uncomplicated: Secondary | ICD-10-CM | POA: Diagnosis not present

## 2020-03-11 DIAGNOSIS — M62838 Other muscle spasm: Secondary | ICD-10-CM | POA: Diagnosis not present

## 2020-03-11 DIAGNOSIS — R110 Nausea: Secondary | ICD-10-CM | POA: Diagnosis not present

## 2020-03-11 DIAGNOSIS — Z79899 Other long term (current) drug therapy: Secondary | ICD-10-CM | POA: Insufficient documentation

## 2020-03-11 DIAGNOSIS — J00 Acute nasopharyngitis [common cold]: Secondary | ICD-10-CM | POA: Diagnosis not present

## 2020-03-11 DIAGNOSIS — T50905A Adverse effect of unspecified drugs, medicaments and biological substances, initial encounter: Secondary | ICD-10-CM

## 2020-03-11 DIAGNOSIS — I10 Essential (primary) hypertension: Secondary | ICD-10-CM | POA: Insufficient documentation

## 2020-03-11 HISTORY — DX: COVID-19: U07.1

## 2020-03-11 LAB — BASIC METABOLIC PANEL
Anion gap: 8 (ref 5–15)
BUN: 13 mg/dL (ref 6–20)
CO2: 24 mmol/L (ref 22–32)
Calcium: 8.9 mg/dL (ref 8.9–10.3)
Chloride: 107 mmol/L (ref 98–111)
Creatinine, Ser: 0.75 mg/dL (ref 0.44–1.00)
GFR, Estimated: >60 mL/min (ref >60)
Glucose, Bld: 123 mg/dL — ABNORMAL HIGH (ref 70–99)
Potassium: 3.9 mmol/L (ref 3.5–5.1)
Sodium: 139 mmol/L (ref 135–145)

## 2020-03-11 LAB — TSH: TSH: 2.116 u[IU]/mL (ref 0.350–4.500)

## 2020-03-11 LAB — CBC
HCT: 38.8 % (ref 36.0–46.0)
Hemoglobin: 13.3 g/dL (ref 12.0–15.0)
MCH: 31.1 pg (ref 26.0–34.0)
MCHC: 34.3 g/dL (ref 30.0–36.0)
MCV: 90.9 fL (ref 80.0–100.0)
Platelets: 244 10*3/uL (ref 150–400)
RBC: 4.27 MIL/uL (ref 3.87–5.11)
RDW: 13.2 % (ref 11.5–15.5)
WBC: 10.2 10*3/uL (ref 4.0–10.5)
nRBC: 0 % (ref 0.0–0.2)

## 2020-03-11 LAB — I-STAT BETA HCG BLOOD, ED (MC, WL, AP ONLY): I-stat hCG, quantitative: <5 m[IU]/mL (ref <5)

## 2020-03-11 NOTE — ED Provider Notes (Signed)
[facility] DEPT Provider Note   CSN: [id number] Arrival date & time: 03/11/20  6283     History Chief Complaint  Patient presents with  . Nausea  . Allergic Reaction    [name] is a 35 y.o. female.  HPI Patient presents with few different complaints.  Nausea coldness spasms foot tightness.  States she feels kind of weak all over.  Had COVID couple months ago.  Started feeling bad a few days ago.  Has recently started fluvoxamine and a new steroid inhaler.  States she has been on an SSRI previously.  States she had been on Celexa but finished that in December.  Has not had symptoms like this before.  States she had a relatively mild case of Covid.  Also has thyroid issues.  Last checked around 2 to 3 months ago.    Past Medical History:  Diagnosis Date  . Anemia   . Anxiety   . Asthma    chronic bronchitis  . Celiac disease   . Cervical polyp   . COVID   . Endometrial polyp   . Headache   . Infection    UTI  . Kidney stones     Patient Active Problem List   Diagnosis Date Noted  . Maternal anemia, with delivery 09/08/2018  . Anxiety during pregnancy 09/08/2018  . Encounter for planned induction of labor 09/06/2018  . Postpartum care following cesarean delivery (8/5) 09/06/2018  . Cesarean delivery 09/06/2018  . History of food allergy 08/29/2017  . Seasonal and perennial allergic rhinitis 08/29/2017  . Allergic conjunctivitis 08/29/2017  . 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 Mild intermittent asthma 08/29/2017  . Cigarette smoker 10/31/2016  . Family history of genetic disorder 10/15/2016  . Mild persistent chronic asthma without complication 66/29/4765  . Pulmonary hypertension ([place]) 12/23/2014  . Obesity (BMI 30-39.9) 12/23/2014  . GERD (gastroesophageal reflux disease) 12/23/2014    Past Surgical History:   Procedure Laterality Date  . ADENOIDECTOMY    . CESAREAN SECTION N/A 09/06/2018   Procedure: CESAREAN SECTION;  Surgeon: Aloha Gell, MD;  Location: [facility];  Service: Obstetrics;  Laterality: N/A;  . DILATION AND CURETTAGE OF UTERUS  01/2017   polyp removed   . FOOT SURGERY Left   . HIP SURGERY    . TONSILLECTOMY       OB History    Gravida  1   Para  1   Term  1   Preterm  0   AB  0   Living  1     SAB  0   IAB  0   Ectopic  0   Multiple  0   Live Births  1           Family History  Problem Relation Age of Onset  . Transient ischemic attack Mother 31  . Hyperlipidemia Mother 8  . Mitral valve prolapse Mother   . Thyroid disease Mother   . Asthma Mother   . Obesity Father   . Hearing loss Father   . Celiac disease Father   . Celiac disease Brother   . Diabetes Maternal Grandmother   . Hyperlipidemia Maternal Grandmother   . Kidney disease Maternal Grandmother   . Skin cancer Paternal Grandfather   . Heart disease Paternal Grandfather   . Heart attack Maternal Grandfather 63  . Emphysema Maternal Grandfather  smoked  . Alpha-1 antitrypsin deficiency Maternal Grandfather   . COPD Maternal Grandfather   . Other Paternal Grandmother        neuroendocrine cancer  . Skin cancer Paternal Grandmother   . Hypertension Paternal Grandmother   . Thyroid disease Sister   . Cystic fibrosis Sister     Social History   Tobacco Use  . Smoking status: Former Smoker    Packs/day: 0.25    Years: 16.00    Pack years: 4.00    Types: Cigarettes    Quit date: 12/28/2017    Years since quitting: 2.2  . Smokeless tobacco: Never Used  Vaping Use  . Vaping Use: Never used  Substance Use Topics  . Alcohol use: Not Currently    Alcohol/week: 0.0 standard drinks    Comment: 2 glasses a week  . Drug use: No    Home Medications Prior to Admission medications   Medication Sig Start Date End Date Taking? Authorizing Provider  Cholecalciferol  (VITAMIN D3) 2000 units capsule Take 2,000 Units by mouth daily.    [provider]  EPINEPHrine 0.3 mg/0.3 mL IJ SOAJ injection Inject 0.3 mg into the muscle as needed for anaphylaxis.  07/27/17   [provider]  famotidine (PEPCID) 20 MG tablet Take 20 mg by mouth 2 (two) times daily.    [provider]  guaiFENesin-codeine 100-10 MG/5ML syrup Take 10 mLs by mouth every 6 (six) hours as needed for cough. 02/23/20   [provider]  hydrocortisone cream 1 % Apply topically 3 (three) times daily. 09/09/18   Juliene Pina, CNM  ibuprofen (ADVIL) 800 MG tablet Take 1 tablet (800 mg total) by mouth every 8 (eight) hours. 09/09/18   Juliene Pina, CNM  Magnesium 400 MG CAPS Take 1 tablet by mouth daily.    [provider]  montelukast (SINGULAIR) 10 MG tablet Take 10 mg by mouth daily. 02/07/20   [provider]  Prenatal Vit-Fe Fumarate-FA (PRENATAL VITAMIN PO) Take 1 tablet by mouth daily.    [provider]  PROAIR RESPICLICK 416 (90 Base) MCG/ACT AEPB Inhale 2 puffs into the lungs every 4 (four) hours as needed. 10/15/16   [provider]  simethicone (MYLICON) 80 MG chewable tablet Chew 1 tablet (80 mg total) by mouth as needed for flatulence. 09/09/18   Juliene Pina, CNM  TIROSINT 75 MCG CAPS Take 75 mcg by mouth every morning. 02/23/20   [provider]  fluvoxaMINE (LUVOX) 100 MG tablet Take 100 mg by mouth 2 (two) times daily. 03/04/20 03/11/20  [provider]    Allergies    Avelox [moxifloxacin], Cephalosporins, Food, Penicillins, Sulfa antibiotics, Coconut oil, Cyclobenzaprine, Wheat bran, and Biaxin [clarithromycin]  Review of Systems   Review of Systems  Constitutional: Positive for chills. Negative for appetite change.  HENT: Negative for congestion.   Respiratory: Negative for shortness of breath.   Gastrointestinal: Negative for abdominal pain.  Genitourinary: Negative for flank pain.   Musculoskeletal: Positive for myalgias.  Skin: Negative for rash.  Neurological: Negative for weakness.  Hematological: Negative for adenopathy.  Psychiatric/Behavioral: Negative for confusion.    Physical Exam Updated Vital Signs BP 111/80   Pulse 68   Temp (!) 97.5 F (36.4 C) (Oral)   Resp 18   Ht 5' 10"  (1.778 m)   Wt 121.1 kg   SpO2 100%   BMI 38.31 kg/m   Physical Exam Vitals and nursing note reviewed.  HENT:  Head: Atraumatic.     Mouth/Throat:     Mouth: Mucous membranes are moist.  Eyes:     Extraocular Movements: Extraocular movements intact.  Cardiovascular:     Rate and Rhythm: Regular rhythm.  Abdominal:     Tenderness: There is no abdominal tenderness.  Musculoskeletal:        General: No tenderness.     Cervical back: Neck supple.  Skin:    General: Skin is warm.     Capillary Refill: Capillary refill takes less than 2 seconds.  Neurological:     Mental Status: She is alert and oriented to person, place, and time.  Psychiatric:        Mood and Affect: Mood normal.     ED Results / Procedures / Treatments   Labs (all labs ordered are listed, but only abnormal results are displayed) Labs Reviewed  BASIC METABOLIC PANEL - Abnormal; Notable for the following components:      Result Value   Glucose, Bld 123 (*)    All other components within normal limits  CBC  TSH  I-STAT BETA HCG BLOOD, ED ([facility], [facility], [facility] ONLY)    EKG None  Radiology No results found.  Procedures Procedures   Medications Ordered in ED Medications - No data to display  ED Course  I have reviewed the triage vital signs and the nursing notes.  Pertinent labs & imaging results that were available during my care of the patient were reviewed by me and considered in my medical decision making (see chart for details).    MDM Rules/Calculators/A&P                          Patient presents with few different complaints.  Has chills at times.  States that she feels  muscle cramps.  States she feels little off.  Began after starting fluvoxamine at a breathing medication.  History of thyroid issues also however.  Lab work reassuring.  TSH reassuring.  Potentially could be medication side effect.  Has only been on the medicine for a week so we will stop.  Outpatient follow-up as needed.  Instructed that she may take Benadryl which potentially could help with some of the movement issues. Final Clinical Impression(s) / ED Diagnoses Final diagnoses:  Adverse effect of drug, initial encounter    Rx / DC Orders ED Discharge Orders    None       Davonna Belling, MD 03/11/20 [phone]

## 2020-03-11 NOTE — ED Notes (Signed)
Pt in bed watching tv, respirations even and unlabored,denies any needs at this time.

## 2020-03-11 NOTE — ED Triage Notes (Signed)
Patient reports that she has having nausea and a coldness and spasms in her legs since starting meds (Bretzi inhaler and Fluvoxamine) that were prescribed 5 days ago. Patient states she has also been unable to uncurl her toes.

## 2020-03-11 NOTE — Discharge Instructions (Signed)
Stop the Luvox for now.  Benadryl may help if there are some spasms.  Follow-up with your doctor for further evaluation.

## 2021-09-15 ENCOUNTER — Encounter (HOSPITAL_BASED_OUTPATIENT_CLINIC_OR_DEPARTMENT_OTHER): Payer: BC Managed Care – PPO | Attending: Internal Medicine | Admitting: Internal Medicine

## 2021-09-15 DIAGNOSIS — K900 Celiac disease: Secondary | ICD-10-CM | POA: Diagnosis not present

## 2021-09-15 DIAGNOSIS — Z87891 Personal history of nicotine dependence: Secondary | ICD-10-CM | POA: Diagnosis not present

## 2021-09-15 DIAGNOSIS — T24232A Burn of second degree of left lower leg, initial encounter: Secondary | ICD-10-CM | POA: Diagnosis not present

## 2021-09-15 DIAGNOSIS — X19XXXA Contact with other heat and hot substances, initial encounter: Secondary | ICD-10-CM | POA: Diagnosis not present
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
## 2021-09-16 NOTE — Progress Notes (Signed)
[name], [name] ([id number]) Visit Report for 09/15/2021 Abuse Risk Screen Details Patient Name: Date of Service: [name] 09/15/2021 8:00 A M Medical Record Number: [id number] Patient Account Number: [id number] Date of Birth/Sex: Treating RN: 04-01-1985 (36 y.o. [name], Tammi Klippel Primary Care [name]: Yaakov Guthrie Other Clinician: Referring Ross Bender: Treating Kasten Leveque/Extender: Francis Gaines in Treatment: 0 Abuse Risk Screen Items Answer ABUSE RISK SCREEN: Has anyone close to you tried to hurt or harm you recentlyo No Do you feel uncomfortable with anyone in your familyo No Has anyone forced you do things that you didnt want to doo No Electronic Signature(s) Signed: 09/15/2021 4:22:40 PM By: Deon Pilling RN, BSN Entered By: Deon Pilling on 09/15/2021 08:26:01 -------------------------------------------------------------------------------- Activities of Daily Living Details Patient Name: Date of Service: [name] 09/15/2021 8:00 A M Medical Record Number: [id number] Patient Account Number: [id number] Date of Birth/Sex: Treating RN: Jan 28, 1986 (36 y.o. [name], Tammi Klippel Primary Care Philmore Lepore: Yaakov Guthrie Other Clinician: Referring Shyne Lehrke: Treating Breton Berns/Extender: Francis Gaines in Treatment: 0 Activities of Daily Living Items Answer Activities of Daily Living (Please select one for each item) Drive Automobile Completely Able T Medications ake Completely Able Use T elephone Completely Able Care for Appearance Completely Able Use T oilet Completely Able Bath / Shower Completely Able Dress Self Completely Able Feed Self Completely Able Walk Completely Able Get In / Out Bed Completely Able Housework Completely Able Prepare Meals Completely [place] for Self Completely Able Electronic Signature(s) Signed: 09/15/2021 4:22:40 PM By: Deon Pilling RN, BSN Entered  By: Deon Pilling on 09/15/2021 08:26:16 -------------------------------------------------------------------------------- Education Screening Details Patient Name: Date of Service: [name] 09/15/2021 8:00 A M Medical Record Number: [id number] Patient Account Number: [id number] Date of Birth/Sex: Treating RN: May 10, 1985 (36 y.o. [name], Tammi Klippel Primary Care Peter Daquila: Yaakov Guthrie Other Clinician: Referring Andrian Sabala: Treating Alisha Bacus/Extender: Francis Gaines in Treatment: 0 Primary Learner Assessed: Patient Learning Preferences/Education Level/Primary Language Learning Preference: Explanation, Demonstration, Printed Material Highest Education Level: College or Above Preferred Language: English Cognitive Barrier Language Barrier: No Translator Needed: No Memory Deficit: No Emotional Barrier: No Cultural/Religious Beliefs Affecting Medical Care: No Physical Barrier Impaired Vision: No Impaired Hearing: No Decreased Hand dexterity: No Knowledge/Comprehension Knowledge Level: High Comprehension Level: High Ability to understand written instructions: High Ability to understand verbal instructions: High Motivation Anxiety Level: Calm Cooperation: Cooperative Education Importance: Acknowledges Need Interest in Health Problems: Asks Questions Perception: Coherent Willingness to Engage in Self-Management High Activities: Readiness to Engage in Self-Management High Activities: Electronic Signature(s) Signed: 09/15/2021 4:22:40 PM By: Deon Pilling RN, BSN Entered By: Deon Pilling on 09/15/2021 08:26:59 -------------------------------------------------------------------------------- Fall Risk Assessment Details Patient Name: Date of Service: [name] 09/15/2021 8:00 A M Medical Record Number: [id number] Patient Account Number: [id number] Date of Birth/Sex: Treating RN: Jun 30, 1985 (36 y.o. [name], Tammi Klippel Primary Care  Hasel Janish: Yaakov Guthrie Other Clinician: Referring Darius Fillingim: Treating Jovanna Hodges/Extender: Francis Gaines in Treatment: 0 Fall Risk Assessment Items Have you had 2 or more falls in the last 12 monthso 0 No Have you had any fall that resulted in injury in the last 12 monthso 0 No FALLS RISK SCREEN History of falling - immediate or within 3 months 0 No Secondary diagnosis (Do you have 2 or more medical diagnoseso) 0 No Ambulatory aid None/bed rest/wheelchair/nurse 0 Yes Crutches/cane/walker 0 No Furniture 0 No Intravenous therapy Access/Saline/Heparin Lock 0 No Gait/Transferring Normal/  bed rest/ wheelchair 0 Yes Weak (short steps with or without shuffle, stooped but able to lift head while walking, may seek 0 No support from furniture) Impaired (short steps with shuffle, may have difficulty arising from chair, head down, impaired 0 No balance) Mental Status Oriented to own ability 0 Yes Electronic Signature(s) Signed: 09/15/2021 4:22:40 PM By: Deon Pilling RN, BSN Entered By: Deon Pilling on 09/15/2021 08:27:06 -------------------------------------------------------------------------------- Foot Assessment Details Patient Name: Date of Service: [name] 09/15/2021 8:00 A M Medical Record Number: [id number] Patient Account Number: [id number] Date of Birth/Sex: Treating RN: May 24, 1985 (36 y.o. [name] Primary Care Mellina Benison: Yaakov Guthrie Other Clinician: Referring Tivis Wherry: Treating Jequan Shahin/Extender: Francis Gaines in Treatment: 0 Foot Assessment Items Site Locations + = Sensation present, - = Sensation absent, C = Callus, U = Ulcer R = Redness, W = Warmth, M = Maceration, PU = Pre-ulcerative lesion F = Fissure, S = Swelling, D = Dryness Assessment Right: Left: Other Deformity: No No Prior Foot Ulcer: No No Prior Amputation: No No Charcot Joint: No No Ambulatory Status: Ambulatory Without  Help Gait: Steady Electronic Signature(s) Signed: 09/15/2021 4:22:40 PM By: Deon Pilling RN, BSN Entered By: Deon Pilling on 09/15/2021 08:46:07 -------------------------------------------------------------------------------- Nutrition Risk Screening Details Patient Name: Date of Service: [name] 09/15/2021 8:00 A M Medical Record Number: [id number] Patient Account Number: [id number] Date of Birth/Sex: Treating RN: 09-26-1985 (36 y.o. [name], Tammi Klippel Primary Care Demeshia Sherburne: Yaakov Guthrie Other Clinician: Referring Brion Sossamon: Treating Tyus Kallam/Extender: Francis Gaines in Treatment: 0 Height (in): 70 Weight (lbs): 262 Body Mass Index (BMI): 37.6 Nutrition Risk Screening Items Score Screening NUTRITION RISK SCREEN: I have an illness or condition that made me change the kind and/or amount of food I eat 2 Yes I eat fewer than two meals per day 0 No I eat few fruits and vegetables, or milk products 0 No I have three or more drinks of beer, liquor or wine almost every day 0 No I have tooth or mouth problems that make it hard for me to eat 0 No I don't always have enough money to buy the food I need 0 No I eat alone most of the time 0 No I take three or more different prescribed or over-the-counter drugs a day 1 Yes Without wanting to, I have lost or gained 10 pounds in the last six months 0 No I am not always physically able to shop, cook and/or feed myself 0 No Nutrition Protocols Good Risk Protocol Moderate Risk Protocol 0 Provide education on nutrition High Risk Proctocol Risk Level: Moderate Risk Score: 3 Electronic Signature(s) Signed: 09/15/2021 4:22:40 PM By: Deon Pilling RN, BSN Entered By: Deon Pilling on 09/15/2021 08:27:13

## 2021-09-21 ENCOUNTER — Encounter (HOSPITAL_BASED_OUTPATIENT_CLINIC_OR_DEPARTMENT_OTHER): Payer: BC Managed Care – PPO | Attending: Internal Medicine | Admitting: Internal Medicine

## 2021-09-22 ENCOUNTER — Encounter (HOSPITAL_BASED_OUTPATIENT_CLINIC_OR_DEPARTMENT_OTHER): Payer: BC Managed Care – PPO | Admitting: Internal Medicine

## 2021-09-22 DIAGNOSIS — T24232A Burn of second degree of left lower leg, initial encounter: Secondary | ICD-10-CM | POA: Diagnosis not present

## 2021-09-22 DIAGNOSIS — K900 Celiac disease: Secondary | ICD-10-CM | POA: Diagnosis not present
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
## 2021-09-22 NOTE — Progress Notes (Signed)
Wanda, Edwards ([id number]) Visit Report for 09/22/2021 Arrival Information Details Patient Name: Date of Service: IJA Edwards, [name] 09/22/2021 3:00 PM Medical Record Number: [id number] Patient Account Number: [id number] Date of Birth/Sex: Treating RN: [birth year] (36 y.o. [name], Meta.Reding Primary Care Klever Twyford: Yaakov Guthrie Other Clinician: Referring [name]: Treating Morell Mears/Extender: Francis Gaines in Treatment: 1 Visit Information History Since Last Visit Added or deleted any medications: No Patient Arrived: Ambulatory Any new allergies or adverse reactions: No Arrival Time: 14:59 Had a fall or experienced change in No Accompanied By: self activities of daily living that may affect Transfer Assistance: None risk of falls: Patient Identification Verified: Yes Signs or symptoms of abuse/neglect since last visito No Secondary Verification Process Completed: Yes Hospitalized since last visit: No Patient Requires Transmission-Based Precautions: No Implantable device outside of the clinic excluding No Patient Has Alerts: No cellular tissue based products placed in the center since last visit: Has Dressing in Place as Prescribed: Yes Pain Present Now: No Electronic Signature(s) Signed: 09/22/2021 4:08:49 PM By: Erenest Blank Entered By: Erenest Blank on 09/22/2021 15:00:00 -------------------------------------------------------------------------------- Clinic Level of Care Assessment Details Patient Name: Date of Service: IJA Edwards, [name] 09/22/2021 3:00 PM Medical Record Number: [id number] Patient Account Number: [id number] Date of Birth/Sex: Treating RN: 28-Aug-1985 (36 y.o. [name], [name] Primary Care Foster Frericks: Yaakov Guthrie Other Clinician: Referring [name]: Treating [name]/Extender: Francis Gaines in Treatment: 1 Clinic Level of Care Assessment Items TOOL 4 Quantity Score X- 1 0 Use when only an  EandM is performed on FOLLOW-UP visit ASSESSMENTS - Nursing Assessment / Reassessment X- 1 10 Reassessment of Co-morbidities (includes updates in patient status) X- 1 5 Reassessment of Adherence to Treatment Plan ASSESSMENTS - Wound and Skin A ssessment / Reassessment X - Simple Wound Assessment / Reassessment - one wound 1 5 []  - 0 Complex Wound Assessment / Reassessment - multiple wounds []  - 0 Dermatologic / Skin Assessment (not related to wound area) ASSESSMENTS - Focused Assessment X- 1 5 Circumferential Edema Measurements - multi extremities []  - 0 Nutritional Assessment / Counseling / Intervention []  - 0 Lower Extremity Assessment (monofilament, tuning fork, pulses) []  - 0 Peripheral Arterial Disease Assessment (using hand held doppler) ASSESSMENTS - Ostomy and/or Continence Assessment and Care []  - 0 Incontinence Assessment and Management []  - 0 Ostomy Care Assessment and Management (repouching, etc.) PROCESS - Coordination of Care X - Simple Patient / Family Education for ongoing care 1 15 []  - 0 Complex (extensive) Patient / Family Education for ongoing care X- 1 10 Staff obtains Programmer, systems, Records, T Results / Process Orders est []  - 0 Staff telephones HHA, Nursing Homes / Clarify orders / etc []  - 0 Routine Transfer to another Facility (non-emergent condition) []  - 0 Routine Hospital Admission (non-emergent condition) []  - 0 New Admissions / Biomedical engineer / Ordering NPWT Apligraf, etc. , []  - 0 Emergency Hospital Admission (emergent condition) X- 1 10 Simple Discharge Coordination []  - 0 Complex (extensive) Discharge Coordination PROCESS - Special Needs []  - 0 Pediatric / Minor Patient Management []  - 0 Isolation Patient Management []  - 0 Hearing / Language / Visual special needs []  - 0 Assessment of Community assistance (transportation, D/C planning, etc.) []  - 0 Additional assistance / Altered mentation []  - 0 Support Surface(s)  Assessment (bed, cushion, seat, etc.) INTERVENTIONS - Wound Cleansing / Measurement X - Simple Wound Cleansing - one wound 1 5 []  - 0 Complex Wound Cleansing - multiple wounds []  -  0 Wound Imaging (photographs - any number of wounds) []  - 0 Wound Tracing (instead of photographs) X- 1 5 Simple Wound Measurement - one wound []  - 0 Complex Wound Measurement - multiple wounds INTERVENTIONS - Wound Dressings X - Small Wound Dressing one or multiple wounds 1 10 []  - 0 Medium Wound Dressing one or multiple wounds []  - 0 Large Wound Dressing one or multiple wounds []  - 0 Application of Medications - topical []  - 0 Application of Medications - injection INTERVENTIONS - Miscellaneous []  - 0 External ear exam []  - 0 Specimen Collection (cultures, biopsies, blood, body fluids, etc.) []  - 0 Specimen(s) / Culture(s) sent or taken to Lab for analysis []  - 0 Patient Transfer (multiple staff / Civil Service fast streamer / Similar devices) []  - 0 Simple Staple / Suture removal (25 or less) []  - 0 Complex Staple / Suture removal (26 or more) []  - 0 Hypo / Hyperglycemic Management (close monitor of Blood Glucose) []  - 0 Ankle / Brachial Index (ABI) - do not check if billed separately X- 1 5 Vital Signs Has the patient been seen at the hospital within the last three years: Yes Total Score: 85 Level Of Care: New/Established - Level 3 Electronic Signature(s) Signed: 09/22/2021 3:55:04 PM By: Deon Pilling RN, BSN Entered By: Deon Pilling on 09/22/2021 15:36:05 -------------------------------------------------------------------------------- Encounter Discharge Information Details Patient Name: Date of Service: IJA Edwards, [name] 09/22/2021 3:00 PM Medical Record Number: [id number] Patient Account Number: [id number] Date of Birth/Sex: Treating RN: 23-Apr-1985 (36 y.o. [name], [name] Primary Care [name]: Yaakov Guthrie Other Clinician: Referring [name]: Treating Brynn Mulgrew/Extender: Francis Gaines in Treatment: 1 Encounter Discharge Information Items Discharge Condition: Stable Ambulatory Status: Ambulatory Discharge Destination: Home Transportation: Private Auto Accompanied By: self Schedule Follow-up Appointment: Yes Clinical Summary of Care: Electronic Signature(s) Signed: 09/22/2021 3:55:04 PM By: Deon Pilling RN, BSN Entered By: Deon Pilling on 09/22/2021 15:37:12 -------------------------------------------------------------------------------- Lower Extremity Assessment Details Patient Name: Date of Service: IJA Edwards, [name] 09/22/2021 3:00 PM Medical Record Number: [id number] Patient Account Number: [id number] Date of Birth/Sex: Treating RN: 04-03-1985 (36 y.o. [name], [name] Primary Care Kinzlee Selvy: Yaakov Guthrie Other Clinician: Referring Avey Mcmanamon: Treating Kathy Wahid/Extender: Francis Gaines in Treatment: 1 Edema Assessment Assessed: [Left: No] [Right: No] Edema: [Left: N] [Right: o] Calf Left: Right: Point of Measurement: 32 cm From Medial Instep 44.5 cm Ankle Left: Right: Point of Measurement: 9 cm From Medial Instep 24 cm Vascular Assessment Pulses: Dorsalis Pedis Palpable: [Left:Yes] Electronic Signature(s) Signed: 09/22/2021 3:55:04 PM By: Deon Pilling RN, BSN Signed: 09/22/2021 4:08:49 PM By: Erenest Blank Entered By: Erenest Blank on 09/22/2021 15:05:23 -------------------------------------------------------------------------------- Multi Wound Chart Details Patient Name: Date of Service: IJA Edwards, [name] 09/22/2021 3:00 PM Medical Record Number: [id number] Patient Account Number: [id number] Date of Birth/Sex: Treating RN: 1985/10/18 (36 y.o. [name] Primary Care Advith Martine: Yaakov Guthrie Other Clinician: Referring Riyanna Crutchley: Treating Elianys Conry/Extender: Francis Gaines in Treatment: 1 Vital Signs Height(in): 70 Pulse(bpm): 58 Weight(lbs):  262 Blood Pressure(mmHg): 111/76 Body Mass Index(BMI): 37.6 Temperature(F): 97.8 Respiratory Rate(breaths/min): 18 Photos: [N/A:N/A] Left, Lateral Lower Leg N/A N/A Wound Location: Thermal Burn N/A N/A Wounding Event: 2nd degree Burn N/A N/A Primary Etiology: Asthma N/A N/A Comorbid History: 07/02/2021 N/A N/A Date Acquired: 1 N/A N/A Weeks of Treatment: Open N/A N/A Wound Status: No N/A N/A Wound Recurrence: 1x1.7x0.1 N/A N/A Measurements L x W x D (cm) 1.335 N/A N/A A (cm) : rea 0.134  N/A N/A Volume (cm) : 26.40% N/A N/A % Reduction in A rea: 26.00% N/A N/A % Reduction in Volume: Full Thickness Without Exposed N/A N/A Classification: Support Structures Medium N/A N/A Exudate Amount: Serosanguineous N/A N/A Exudate Type: red, brown N/A N/A Exudate Color: Distinct, outline attached N/A N/A Wound Margin: Large (67-100%) N/A N/A Granulation Amount: Red, Pink, Hyper-granulation N/A N/A Granulation Quality: Small (1-33%) N/A N/A Necrotic Amount: Fat Layer (Subcutaneous Tissue): Yes N/A N/A Exposed Structures: Fascia: No Tendon: No Muscle: No Joint: No Bone: No Small (1-33%) N/A N/A Epithelialization: Treatment Notes Wound #1 (Lower Leg) Wound Laterality: Left, Lateral Cleanser Vashe Discharge Instruction: cleanse wound after showering with Vashe. Peri-Wound Care Topical Primary Dressing MediHoney Gel, tube 1.5 (oz) Discharge Instruction: Apply to wound bed as instructed Secondary Dressing Woven Gauze Sponges 2x2 in Discharge Instruction: Apply over primary dressing as directed. Zetuvit Plus Silicone Border Dressing 4x4 (in/in) Discharge Instruction: Apply silicone border or bandaid. Secured With Compression Wrap Compression Stockings Environmental education officer) Signed: 09/22/2021 3:51:23 PM By: Kalman Shan DO Signed: 09/22/2021 3:55:04 PM By: Deon Pilling RN, BSN Entered By: Kalman Shan on 09/22/2021  15:43:16 -------------------------------------------------------------------------------- Multi-Disciplinary Care Plan Details Patient Name: Date of Service: IJA Edwards, [name] 09/22/2021 3:00 PM Medical Record Number: [id number] Patient Account Number: [id number] Date of Birth/Sex: Treating RN: 06/29/1985 (36 y.o. [name], [name] Primary Care Athira Janowicz: Yaakov Guthrie Other Clinician: Referring Annalycia Done: Treating Amber Williard/Extender: Francis Gaines in Treatment: 1 Active Inactive Pain, Acute or Chronic Nursing Diagnoses: Pain, acute or chronic: actual or potential Potential alteration in comfort, pain Goals: Patient will verbalize adequate pain control and receive pain control interventions during procedures as needed Date Initiated: 09/15/2021 Target Resolution Date: 10/08/2021 Goal Status: Active Patient/caregiver will verbalize comfort level met Date Initiated: 09/15/2021 Target Resolution Date: 10/08/2021 Goal Status: Active Interventions: Encourage patient to take pain medications as prescribed Provide education on pain management Reposition patient for comfort Treatment Activities: Administer pain control measures as ordered : 09/15/2021 Notes: Wound/Skin Impairment Nursing Diagnoses: Knowledge deficit related to ulceration/compromised skin integrity Goals: Patient/caregiver will verbalize understanding of skin care regimen Date Initiated: 09/15/2021 Target Resolution Date: 10/08/2021 Goal Status: Active Interventions: Assess patient/caregiver ability to perform ulcer/skin care regimen upon admission and as needed Assess ulceration(s) every visit Provide education on ulcer and skin care Treatment Activities: Skin care regimen initiated : 09/15/2021 Topical wound management initiated : 09/15/2021 Notes: Electronic Signature(s) Signed: 09/22/2021 3:55:04 PM By: Deon Pilling RN, BSN Entered By: Deon Pilling on 09/22/2021  15:17:02 -------------------------------------------------------------------------------- Pain Assessment Details Patient Name: Date of Service: IJA Edwards, [name] 09/22/2021 3:00 PM Medical Record Number: [id number] Patient Account Number: [id number] Date of Birth/Sex: Treating RN: 06/04/1985 (36 y.o. [name], [name] Primary Care Breda Bond: Yaakov Guthrie Other Clinician: Referring Elvi Leventhal: Treating Annina Piotrowski/Extender: Francis Gaines in Treatment: 1 Active Problems Location of Pain Severity and Description of Pain Patient Has Paino No Site Locations Pain Management and Medication Current Pain Management: Electronic Signature(s) Signed: 09/22/2021 3:55:04 PM By: Deon Pilling RN, BSN Signed: 09/22/2021 4:08:49 PM By: Erenest Blank Signed: 09/22/2021 4:08:49 PM By: Erenest Blank Entered By: Erenest Blank on 09/22/2021 15:01:36 -------------------------------------------------------------------------------- Patient/Caregiver Education Details Patient Name: Date of Service: [facility], [name] 8/22/2023andnbsp3:00 PM Medical Record Number: [id number] Patient Account Number: [id number] Date of Birth/Gender: Treating RN: May 07, 1985 (36 y.o. [name], [name] Primary Care Physician: Yaakov Guthrie Other Clinician: Referring Physician: Treating Physician/Extender: Francis Gaines in Treatment: 1 Education Assessment Education Provided  To: Patient Education Topics Provided Wound/Skin Impairment: Handouts: Skin Care Do's and Dont's Methods: Explain/Verbal Responses: Reinforcements needed Electronic Signature(s) Signed: 09/22/2021 3:55:04 PM By: Deon Pilling RN, BSN Entered By: Deon Pilling on 09/22/2021 15:17:11 -------------------------------------------------------------------------------- Wound Assessment Details Patient Name: Date of Service: IJA Edwards, [name] 09/22/2021 3:00 PM Medical Record Number:  [id number] Patient Account Number: [id number] Date of Birth/Sex: Treating RN: 02/13/1985 (36 y.o. [name] Primary Care Bonney Berres: Yaakov Guthrie Other Clinician: Referring Jcion Buddenhagen: Treating Taralee Marcus/Extender: Francis Gaines in Treatment: 1 Wound Status Wound Number: 1 Primary Etiology: 2nd degree Burn Wound Location: Left, Lateral Lower Leg Wound Status: Open Wounding Event: Thermal Burn Comorbid History: Asthma Date Acquired: 07/02/2021 Weeks Of Treatment: 1 Clustered Wound: No Photos Wound Measurements Length: (cm) 1 Width: (cm) 1.7 Depth: (cm) 0.1 Area: (cm) 1.335 Volume: (cm) 0.134 % Reduction in Area: 26.4% % Reduction in Volume: 26% Epithelialization: Small (1-33%) Tunneling: No Undermining: No Wound Description Classification: Full Thickness Without Exposed Support Structures Wound Margin: Distinct, outline attached Exudate Amount: Medium Exudate Type: Serosanguineous Exudate Color: red, brown Foul Odor After Cleansing: No Slough/Fibrino Yes Wound Bed Granulation Amount: Large (67-100%) Exposed Structure Granulation Quality: Red, Pink, Hyper-granulation Fascia Exposed: No Necrotic Amount: Small (1-33%) Fat Layer (Subcutaneous Tissue) Exposed: Yes Necrotic Quality: Adherent Slough Tendon Exposed: No Muscle Exposed: No Joint Exposed: No Bone Exposed: No Treatment Notes Wound #1 (Lower Leg) Wound Laterality: Left, Lateral Cleanser Vashe Discharge Instruction: cleanse wound after showering with Vashe. Peri-Wound Care Topical Primary Dressing MediHoney Gel, tube 1.5 (oz) Discharge Instruction: Apply to wound bed as instructed Secondary Dressing Woven Gauze Sponges 2x2 in Discharge Instruction: Apply over primary dressing as directed. Zetuvit Plus Silicone Border Dressing 4x4 (in/in) Discharge Instruction: Apply silicone border or bandaid. Secured With Compression Wrap Compression Stockings Sport and exercise psychologist) Signed: 09/22/2021 3:55:04 PM By: Deon Pilling RN, BSN Signed: 09/22/2021 4:08:49 PM By: Erenest Blank Entered By: Erenest Blank on 09/22/2021 15:06:20 -------------------------------------------------------------------------------- Vitals Details Patient Name: Date of Service: IJA Edwards, [name] 09/22/2021 3:00 PM Medical Record Number: [id number] Patient Account Number: [id number] Date of Birth/Sex: Treating RN: 1986-01-11 (36 y.o. [name], [name] Primary Care Pao Haffey: Yaakov Guthrie Other Clinician: Referring Takai Chiaramonte: Treating Gearald Stonebraker/Extender: Francis Gaines in Treatment: 1 Vital Signs Time Taken: 15:01 Temperature (F): 97.8 Height (in): 70 Pulse (bpm): 58 Weight (lbs): 262 Respiratory Rate (breaths/min): 18 Body Mass Index (BMI): 37.6 Blood Pressure (mmHg): 111/76 Reference Range: 80 - 120 mg / dl Electronic Signature(s) Signed: 09/22/2021 4:08:49 PM By: Erenest Blank Entered By: Erenest Blank on 09/22/2021 15:01:31

## 2021-10-02 NOTE — Progress Notes (Signed)
[name], [name] ([id number]) Visit Report for 09/15/2021 Allergy List Details Patient Name: Date of Service: [name], [name] 09/15/2021 8:00 A M Medical Record Number: [id number] Patient Account Number: [id number] Date of Birth/Sex: Treating RN: 1985-12-06 (36 y.o. [name], [name] Primary Care [name]: [name] Other Clinician: Referring [name]: Treating [name]/Extender: [name] in Treatment: 0 Allergies Active Allergies penicillin Reaction: anaphaylaxsis Sulfa (Sulfonamide Antibiotics) Reaction: chest pain Cephalosporins Reaction: chest pain [name] Reaction: vomiting [name] Reaction: vomiting wheat pine nut Reaction: anaphylaxis Allergy Notes Electronic Signature(s) Signed: 09/15/2021 4:22:40 PM By: Wanda Pilling RN, BSN Entered By: [name] on 09/15/2021 08:23:28 -------------------------------------------------------------------------------- Arrival Information Details Patient Name: Date of Service: [name], [name] 09/15/2021 8:00 A M Medical Record Number: [id number] Patient Account Number: [id number] Date of Birth/Sex: Treating RN: [birth year] (36 y.o. [name] Primary Care [name]: [name] Other Clinician: Referring [name]: Treating [name]/Extender: [name] in Treatment: 0 Visit Information Patient Arrived: Ambulatory Arrival Time: 08:16 Accompanied By: self Transfer Assistance: None Patient Identification Verified: Yes Secondary Verification Process Completed: Yes Patient Requires Transmission-Based Precautions: No Patient Has Alerts: No Electronic Signature(s) Signed: 09/15/2021 4:22:40 PM By: Wanda Pilling RN, BSN Entered By: [name] on 09/15/2021 08:21:04 -------------------------------------------------------------------------------- Clinic Level of Care Assessment Details Patient Name: Date of Service: [name], [name]. 09/15/2021 8:00 A  M Medical Record Number: [id number] Patient Account Number: [id number] Date of Birth/Sex: Treating RN: [birth year] (36 y.o. [name], [name] Primary Care Pranshu Lyster: [name] Other Clinician: Referring [name]: Treating [name]/Extender: [name] in Treatment: 0 Clinic Level of Care Assessment Items TOOL 1 Quantity Score X- 1 0 Use when EandM and Procedure is performed on INITIAL visit ASSESSMENTS - Nursing Assessment / Reassessment X- 1 20 General Physical Exam (combine w/ comprehensive assessment (listed just below) when performed on new pt. evals) X- 1 25 Comprehensive Assessment (HX, ROS, Risk Assessments, Wounds Hx, etc.) ASSESSMENTS - Wound and Skin Assessment / Reassessment X- 1 10 Dermatologic / Skin Assessment (not related to wound area) ASSESSMENTS - Ostomy and/or Continence Assessment and Care _0  - 0 Incontinence Assessment and Management _1  - 0 Ostomy Care Assessment and Management (repouching, etc.) PROCESS - Coordination of Care X - Simple Patient / Family Education for ongoing care 1 15 _2  - 0 Complex (extensive) Patient / Family Education for ongoing care X- 1 10 Staff obtains Programmer, systems, Records, T Results / Process Orders est _3  - 0 Staff telephones HHA, Nursing Homes / Clarify orders / etc _4  - 0 Routine Transfer to another Facility (non-emergent condition) _5  - 0 Routine Hospital Admission (non-emergent condition) X- 1 15 New Admissions / Biomedical engineer / Ordering NPWT Apligraf, etc. , _6  - 0 Emergency Hospital Admission (emergent condition) PROCESS - Special Needs _7  - 0 Pediatric / Minor Patient Management _8  - 0 Isolation Patient Management _9  - 0 Hearing / Language / Visual special needs _10  - 0 Assessment of Community assistance (transportation, D/C planning, etc.) _11  - 0 Additional assistance / Altered mentation _12  - 0 Support Surface(s) Assessment (bed, cushion, seat, etc.) INTERVENTIONS -  Miscellaneous _13  - 0 External ear exam _14  - 0 Patient Transfer (multiple staff / Civil Service fast streamer / Similar devices) _15  - 0 Simple Staple / Suture removal (25 or less) _16  - 0 Complex Staple / Suture removal (26 or more) _17  - 0 Hypo/Hyperglycemic Management (do not check if billed separately) _18  - 0 Ankle / Brachial Index (ABI) - do not  check if billed separately Has the patient been seen at the hospital within the last three years: Yes Total Score: 95 Level Of Care: New/Established - Level 3 Electronic Signature(s) Signed: 09/15/2021 4:22:40 PM By: Wanda Pilling RN, BSN Entered By: [name] on 09/15/2021 09:09:37 -------------------------------------------------------------------------------- Encounter Discharge Information Details Patient Name: Date of Service: [name], [name] 09/15/2021 8:00 A M Medical Record Number: [id number] Patient Account Number: [id number] Date of Birth/Sex: Treating RN: 1985/07/18 (36 y.o. [name], [name] Primary Care [name]: [name] Other Clinician: Referring [name]: Treating [name]/Extender: [name] in Treatment: 0 Encounter Discharge Information Items Discharge Condition: Stable Ambulatory Status: Ambulatory Discharge Destination: Home Transportation: Private Auto Accompanied By: self Schedule Follow-up Appointment: Yes Clinical Summary of Care: Electronic Signature(s) Signed: 09/15/2021 4:22:40 PM By: Wanda Pilling RN, BSN Entered By: [name] on 09/15/2021 09:14:04 -------------------------------------------------------------------------------- Lower Extremity Assessment Details Patient Name: Date of Service: [name], [name] 09/15/2021 8:00 A M Medical Record Number: [id number] Patient Account Number: [id number] Date of Birth/Sex: Treating RN: [birth year] (36 y.o. [name], Meta.Reding Primary Care Declan Adamson: [name] Other Clinician: Referring Rekisha Welling: Treating Fidelis Loth/Extender:  [name] in Treatment: 0 Edema Assessment Assessed: Shirlyn Goltz: Yes] [Right: No] Edema: [Left: N] [Right: o] Calf Left: Right: Point of Measurement: 32 cm From Medial Instep 49 cm Ankle Left: Right: Point of Measurement: 9 cm From Medial Instep 23.5 cm Knee To Floor Left: Right: From Medial Instep 45 cm Vascular Assessment Pulses: Dorsalis Pedis Palpable: [Left:Yes] Doppler Audible: [Left:Yes] Posterior Tibial Palpable: [Left:Yes] Doppler Audible: [Left:Yes] Blood Pressure: Brachial: [Left:107] Ankle: [Left:Dorsalis Pedis: 100 0.93] Electronic Signature(s) Signed: 09/15/2021 4:22:40 PM By: Wanda Pilling RN, BSN Entered By: [name] on 09/15/2021 08:44:55 -------------------------------------------------------------------------------- Multi Wound Chart Details Patient Name: Date of Service: [name], [name] 09/15/2021 8:00 A M Medical Record Number: [id number] Patient Account Number: [id number] Date of Birth/Sex: Treating RN: 1985-09-23 (36 y.o. [name], Lauren Primary Care Ahlam Piscitelli: [name] Other Clinician: Referring Collins Kerby: Treating Lyfe Monger/Extender: [name] in Treatment: 0 Vital Signs Height(in): 70 Pulse(bpm): 69 Weight(lbs): 262 Blood Pressure(mmHg): 107/74 Body Mass Index(BMI): 37.6 Temperature(F): 98.3 Respiratory Rate(breaths/min): 20 Photos: [N/A:N/A] Left, Lateral Lower Leg N/A N/A Wound Location: Thermal Burn N/A N/A Wounding Event: 2nd degree Burn N/A N/A Primary Etiology: Asthma N/A N/A Comorbid History: 07/02/2021 N/A N/A Date Acquired: 0 N/A N/A Weeks of Treatment: Open N/A N/A Wound Status: No N/A N/A Wound Recurrence: 1.1x2.1x0.1 N/A N/A Measurements L x W x D (cm) 1.814 N/A N/A A (cm) : rea 0.181 N/A N/A Volume (cm) : 0.00% N/A N/A % Reduction in A rea: 0.00% N/A N/A % Reduction in Volume: Full Thickness Without Exposed N/A  N/A Classification: Support Structures Medium N/A N/A Exudate Amount: Serosanguineous N/A N/A Exudate Type: red, brown N/A N/A Exudate Color: Distinct, outline attached N/A N/A Wound Margin: Large (67-100%) N/A N/A Granulation Amount: Red, Pink N/A N/A Granulation Quality: Small (1-33%) N/A N/A Necrotic Amount: Fat Layer (Subcutaneous Tissue): Yes N/A N/A Exposed Structures: Fascia: No Tendon: No Muscle: No Joint: No Bone: No Small (1-33%) N/A N/A Epithelialization: Dressings and/or debridement of N/A N/A Procedures Performed: burns; small Treatment Notes Electronic Signature(s) Signed: 09/15/2021 9:21:42 AM By: Kalman Shan DO Signed: 10/02/2021 1:11:09 PM By: Rhae Hammock RN Entered By: Kalman Shan on 09/15/2021 09:10:18 -------------------------------------------------------------------------------- Multi-Disciplinary Care Plan Details Patient Name: Date of Service: [name], [name]. 09/15/2021 8:00 A M Medical Record Number: [id number] Patient Account Number:  [id number] Date of Birth/Sex: Treating RN: 1985-06-10 (36 y.o. [name], [name] Primary Care Devora Tortorella: [name] Other Clinician: Referring Copeland Lapier: Treating Birch Farino/Extender: [name] in Treatment: 0 Active Inactive Orientation to the Wound Care Program Nursing Diagnoses: Knowledge deficit related to the wound healing center program Goals: Patient/caregiver will verbalize understanding of the [place] Program Date Initiated: 09/15/2021 Target Resolution Date: 10/09/2021 Goal Status: Active Interventions: Provide education on orientation to the wound center Notes: Pain, Acute or Chronic Nursing Diagnoses: Pain, acute or chronic: actual or potential Potential alteration in comfort, pain Goals: Patient will verbalize adequate pain control and receive pain control interventions during procedures as needed Date Initiated:  09/15/2021 Target Resolution Date: 10/08/2021 Goal Status: Active Patient/caregiver will verbalize comfort level met Date Initiated: 09/15/2021 Target Resolution Date: 10/08/2021 Goal Status: Active Interventions: Encourage patient to take pain medications as prescribed Provide education on pain management Reposition patient for comfort Treatment Activities: Administer pain control measures as ordered : 09/15/2021 Notes: Wound/Skin Impairment Nursing Diagnoses: Knowledge deficit related to ulceration/compromised skin integrity Goals: Patient/caregiver will verbalize understanding of skin care regimen Date Initiated: 09/15/2021 Target Resolution Date: 10/08/2021 Goal Status: Active Interventions: Assess patient/caregiver ability to perform ulcer/skin care regimen upon admission and as needed Assess ulceration(s) every visit Provide education on ulcer and skin care Treatment Activities: Skin care regimen initiated : 09/15/2021 Topical wound management initiated : 09/15/2021 Notes: Electronic Signature(s) Signed: 09/15/2021 4:22:40 PM By: Wanda Pilling RN, BSN Entered By: [name] on 09/15/2021 08:56:25 -------------------------------------------------------------------------------- Pain Assessment Details Patient Name: Date of Service: [name], [name] 09/15/2021 8:00 A M Medical Record Number: [id number] Patient Account Number: [id number] Date of Birth/Sex: Treating RN: 1985-03-13 (36 y.o. [name] Primary Care Ladora Osterberg: [name] Other Clinician: Referring Halston Kintz: Treating Kaelyn Nauta/Extender: [name] in Treatment: 0 Active Problems Location of Pain Severity and Description of Pain Patient Has Paino No Site Locations Rate the pain. Current Pain Level: 0 Pain Management and Medication Current Pain Management: Medication: No Cold Application: No Rest: No Massage: No Activity: No T.E.N.S.: No Heat Application: No Leg  drop or elevation: No Is the Current Pain Management Adequate: Adequate How does your wound impact your activities of daily livingo Sleep: No Bathing: No Appetite: No Relationship With Others: No Bladder Continence: No Emotions: No Bowel Continence: No Work: No Toileting: No Drive: No Dressing: No Hobbies: No Engineer, maintenance) Signed: 09/15/2021 4:22:40 PM By: Wanda Pilling RN, BSN Entered By: [name] on 09/15/2021 08:27:24 -------------------------------------------------------------------------------- Patient/Caregiver Education Details Patient Name: Date of Service: [name], [name] 8/15/2023andnbsp8:00 A M Medical Record Number: [id number] Patient Account Number: [id number] Date of Birth/Gender: Treating RN: 11/15/1985 (36 y.o. [name], [name] Primary Care Physician: [name] Other Clinician: Referring Physician: Treating Physician/Extender: [name] in Treatment: 0 Education Assessment Education Provided To: Patient Education Topics Provided Welcome T [facility] [place]: o Handouts: Welcome T [facility] [place] o Methods: Explain/Verbal Responses: Reinforcements needed Electronic Signature(s) Signed: 09/15/2021 4:22:40 PM By: Wanda Pilling RN, BSN Entered By: [name] on 09/15/2021 08:56:47 -------------------------------------------------------------------------------- Wound Assessment Details Patient Name: Date of Service: [name], [name] 09/15/2021 8:00 A M Medical Record Number: [id number] Patient Account Number: [id number] Date of Birth/Sex: Treating RN: 1985/02/17 (36 y.o. [name] Primary Care Jahara Dail: [name] Other Clinician: Referring Malyna Budney: Treating Tyrina Hines/Extender: [name] in Treatment: 0 Wound Status Wound Number: 1 Primary Etiology: 2nd degree Burn Wound Location:  Left, Lateral Lower Leg Wound Status: Open Wounding  Event: Thermal Burn Comorbid History: Asthma Date Acquired: 07/02/2021 Weeks Of Treatment: 0 Clustered Wound: No Photos Wound Measurements Length: (cm) 1.1 Width: (cm) 2.1 Depth: (cm) 0.1 Area: (cm) 1.814 Volume: (cm) 0.181 % Reduction in Area: 0% % Reduction in Volume: 0% Epithelialization: Small (1-33%) Tunneling: No Undermining: No Wound Description Classification: Full Thickness Without Exposed Support Structures Wound Margin: Distinct, outline attached Exudate Amount: Medium Exudate Type: Serosanguineous Exudate Color: red, brown Foul Odor After Cleansing: No Slough/Fibrino Yes Wound Bed Granulation Amount: Large (67-100%) Exposed Structure Granulation Quality: Red, Pink Fascia Exposed: No Necrotic Amount: Small (1-33%) Fat Layer (Subcutaneous Tissue) Exposed: Yes Necrotic Quality: Adherent Slough Tendon Exposed: No Muscle Exposed: No Joint Exposed: No Bone Exposed: No Electronic Signature(s) Signed: 09/15/2021 4:22:40 PM By: Wanda Pilling RN, BSN Entered By: [name] on 09/15/2021 08:40:04 -------------------------------------------------------------------------------- Vitals Details Patient Name: Date of Service: [name], [name] 09/15/2021 8:00 A M Medical Record Number: [id number] Patient Account Number: [id number] Date of Birth/Sex: Treating RN: 1985/06/13 (36 y.o. [name], [name] Primary Care Artice Bergerson: [name] Other Clinician: Referring Caitlynne Harbeck: Treating Nabor Thomann/Extender: [name] in Treatment: 0 Vital Signs Time Taken: 08:15 Temperature (F): 98.3 Height (in): 70 Pulse (bpm): 69 Source: Stated Respiratory Rate (breaths/min): 20 Weight (lbs): 262 Blood Pressure (mmHg): 107/74 Source: Stated Reference Range: 80 - 120 mg / dl Body Mass Index (BMI): 37.6 Electronic Signature(s) Signed: 09/15/2021 4:22:40 PM By: Wanda Pilling RN, BSN Entered By: [name] on 09/15/2021 08:20:53

## 2021-10-09 ENCOUNTER — Other Ambulatory Visit: Payer: Self-pay

## 2021-10-09 ENCOUNTER — Encounter (HOSPITAL_BASED_OUTPATIENT_CLINIC_OR_DEPARTMENT_OTHER): Payer: Self-pay

## 2021-10-09 ENCOUNTER — Emergency Department (HOSPITAL_BASED_OUTPATIENT_CLINIC_OR_DEPARTMENT_OTHER)
Admission: EM | Admit: 2021-10-09 | Discharge: 2021-10-09 | Disposition: A | Discharge status: Home / Self Care | Payer: BC Managed Care – PPO | Attending: Emergency Medicine | Admitting: Emergency Medicine

## 2021-10-09 DIAGNOSIS — Z8616 Personal history of COVID-19: Secondary | ICD-10-CM | POA: Diagnosis not present

## 2021-10-09 DIAGNOSIS — R1010 Upper abdominal pain, unspecified: Secondary | ICD-10-CM | POA: Insufficient documentation

## 2021-10-09 DIAGNOSIS — J45909 Unspecified asthma, uncomplicated: Secondary | ICD-10-CM | POA: Diagnosis not present

## 2021-10-09 DIAGNOSIS — R110 Nausea: Secondary | ICD-10-CM | POA: Insufficient documentation

## 2021-10-09 LAB — COMPREHENSIVE METABOLIC PANEL
ALT: 15 U/L (ref 0–44)
AST: 12 U/L — ABNORMAL LOW (ref 15–41)
Albumin: 4.6 g/dL (ref 3.5–5.0)
Alkaline Phosphatase: 43 U/L (ref 38–126)
Anion gap: 9 (ref 5–15)
BUN: 13 mg/dL (ref 6–20)
CO2: 24 mmol/L (ref 22–32)
Calcium: 9.1 mg/dL (ref 8.9–10.3)
Chloride: 100 mmol/L (ref 98–111)
Creatinine, Ser: 0.68 mg/dL (ref 0.44–1.00)
GFR, Estimated: >60 mL/min (ref >60)
Glucose, Bld: 107 mg/dL — ABNORMAL HIGH (ref 70–99)
Potassium: 4.3 mmol/L (ref 3.5–5.1)
Sodium: 133 mmol/L — ABNORMAL LOW (ref 135–145)
Total Bilirubin: 0.6 mg/dL (ref 0.3–1.2)
Total Protein: 7.4 g/dL (ref 6.5–8.1)

## 2021-10-09 LAB — URINALYSIS, ROUTINE W REFLEX MICROSCOPIC
Bilirubin Urine: NEGATIVE
Glucose, UA: NEGATIVE mg/dL
Hgb urine dipstick: NEGATIVE
Ketones, ur: NEGATIVE mg/dL
Nitrite: NEGATIVE
Protein, ur: NEGATIVE mg/dL
Specific Gravity, Urine: 1.012 (ref 1.005–1.030)
pH: 5.5 (ref 5.0–8.0)

## 2021-10-09 LAB — CBC WITH DIFFERENTIAL/PLATELET
Abs Immature Granulocytes: 0.03 10*3/uL (ref 0.00–0.07)
Basophils Absolute: 0 10*3/uL (ref 0.0–0.1)
Basophils Relative: 0 %
Eosinophils Absolute: 0.1 10*3/uL (ref 0.0–0.5)
Eosinophils Relative: 1 %
HCT: 38 % (ref 36.0–46.0)
Hemoglobin: 12.8 g/dL (ref 12.0–15.0)
Immature Granulocytes: 0 %
Lymphocytes Relative: 20 %
Lymphs Abs: 1.7 10*3/uL (ref 0.7–4.0)
MCH: 30.3 pg (ref 26.0–34.0)
MCHC: 33.7 g/dL (ref 30.0–36.0)
MCV: 90 fL (ref 80.0–100.0)
Monocytes Absolute: 0.4 10*3/uL (ref 0.1–1.0)
Monocytes Relative: 5 %
Neutro Abs: 6.3 10*3/uL (ref 1.7–7.7)
Neutrophils Relative %: 74 %
Platelets: 227 10*3/uL (ref 150–400)
RBC: 4.22 MIL/uL (ref 3.87–5.11)
RDW: 13.2 % (ref 11.5–15.5)
WBC: 8.5 10*3/uL (ref 4.0–10.5)
nRBC: 0 % (ref 0.0–0.2)

## 2021-10-09 LAB — LIPASE, BLOOD: Lipase: 16 U/L (ref 11–51)

## 2021-10-09 LAB — PREGNANCY, URINE: Preg Test, Ur: NEGATIVE

## 2021-10-09 NOTE — ED Triage Notes (Signed)
Pt presents POV from home, reports upper abd pain. Nausea and bloating since last night. Seen at [facility] and sent here for further evaluation

## 2021-10-09 NOTE — ED Provider Notes (Signed)
[place] EMERGENCY DEPT Provider Note   CSN: [id number] Arrival date & time: 10/09/21  1406     History  Chief Complaint  Patient presents with   Abdominal Pain    [name] is a 36 y.o. female.   Abdominal Pain Patient presents with abdominal pain.  Upper abdomen.  Began yesterday.  Does have a history of some chronic abdominal pain.  History of celiac disease.  States she went to an urgent care and was told to come in for blood work and may be a CT.  No fevers.  Has had nausea without vomiting.  Similar pain she has had in the past.  Epigastric to left upper quadrant.  Has had colonoscopies in the past.  No known exposure to gluten.    Past Medical History:  Diagnosis Date   Anemia    Anxiety    Asthma    chronic bronchitis   Celiac disease    Cervical polyp    COVID    Endometrial polyp    Headache    Infection    UTI   Kidney stones     Home Medications Prior to Admission medications   Medication Sig Start Date End Date Taking? Authorizing Provider  Cholecalciferol (VITAMIN D3) 2000 units capsule Take 2,000 Units by mouth daily.    [provider]  EPINEPHrine 0.3 mg/0.3 mL IJ SOAJ injection Inject 0.3 mg into the muscle as needed for anaphylaxis.  07/27/17   [provider]  famotidine (PEPCID) 20 MG tablet Take 20 mg by mouth 2 (two) times daily.    [provider]  guaiFENesin-codeine 100-10 MG/5ML syrup Take 10 mLs by mouth every 6 (six) hours as needed for cough. 02/23/20   [provider]  hydrocortisone cream 1 % Apply topically 3 (three) times daily. 09/09/18   Juliene Pina, CNM  ibuprofen (ADVIL) 800 MG tablet Take 1 tablet (800 mg total) by mouth every 8 (eight) hours. 09/09/18   Juliene Pina, CNM  Magnesium 400 MG CAPS Take 1 tablet by mouth daily.    [provider]  montelukast (SINGULAIR) 10 MG tablet Take 10 mg by mouth daily. 02/07/20   [provider]  Prenatal Vit-Fe  Fumarate-FA (PRENATAL VITAMIN PO) Take 1 tablet by mouth daily.    [provider]  PROAIR RESPICLICK 381 (90 Base) MCG/ACT AEPB Inhale 2 puffs into the lungs every 4 (four) hours as needed. 10/15/16   [provider]  simethicone (MYLICON) 80 MG chewable tablet Chew 1 tablet (80 mg total) by mouth as needed for flatulence. 09/09/18   Juliene Pina, CNM  TIROSINT 75 MCG CAPS Take 75 mcg by mouth every morning. 02/23/20   [provider]  fluvoxaMINE (LUVOX) 100 MG tablet Take 100 mg by mouth 2 (two) times daily. 03/04/20 03/11/20  [provider]      Allergies    Avelox [moxifloxacin], Cephalosporins, Food, Penicillins, Sulfa antibiotics, Coconut (cocos nucifera), Cyclobenzaprine, Wheat bran, and Biaxin [clarithromycin]    Review of Systems   Review of Systems  Gastrointestinal:  Positive for abdominal pain.    Physical Exam Updated Vital Signs BP (!) 133/92   Pulse 71   Temp 98.1 F (36.7 C)   Resp 16   SpO2 99%  Physical Exam Vitals and nursing note reviewed.  HENT:     Head: Normocephalic.  Cardiovascular:     Rate and Rhythm: Regular rhythm.  Pulmonary:     Breath sounds: Normal  breath sounds.  Abdominal:     Hernia: No hernia is present.     Comments: Mild epigastric to left upper quadrant tenderness.  No rebound or guarding.  No hernias palpated.  Neurological:     Mental Status: She is alert.     ED Results / Procedures / Treatments   Labs (all labs ordered are listed, but only abnormal results are displayed) Labs Reviewed  COMPREHENSIVE METABOLIC PANEL - Abnormal; Notable for the following components:      Result Value   Sodium 133 (*)    Glucose, Bld 107 (*)    AST 12 (*)    All other components within normal limits  URINALYSIS, ROUTINE W REFLEX MICROSCOPIC - Abnormal; Notable for the following components:   Leukocytes,Ua SMALL (*)    All other components within normal limits  LIPASE, BLOOD  PREGNANCY, URINE  CBC WITH  DIFFERENTIAL/PLATELET    EKG None  Radiology No results found.  Procedures Procedures    Medications Ordered in ED Medications - No data to display  ED Course/ Medical Decision Making/ A&P                           Medical Decision Making Amount and/or Complexity of Data Reviewed Labs: ordered.   Patient upper abdominal pain.  Dull.  Mild nausea.  Does have a history of some chronic abdominal pain.  Reviewed CT scan from a few years ago and ultrasound from a couple years ago.  Neither had clear pathology at the time.  Does have known celiac disease however.  Blood work reassuring.  Mild hyponatremia.  Discussed with patient.  This point do not think we need CT scan.  Mild exam.  Has had pains like this before.  No gallstones on ultrasound recently.  Patient states it feels like one of the belly pain flares she has had in the past.  Will discharge home with outpatient follow-up with PCP and GI as needed.  Will return for worsening symptoms.  Severe intra abdominal pathology such as severe colitis, appendicitis, cholecystitis felt less likely.        Final Clinical Impression(s) / ED Diagnoses Final diagnoses:  Pain of upper abdomen    Rx / DC Orders ED Discharge Orders     None         Davonna Belling, MD 10/09/21 1541

## 2021-10-10 ENCOUNTER — Emergency Department (HOSPITAL_BASED_OUTPATIENT_CLINIC_OR_DEPARTMENT_OTHER): Payer: BC Managed Care – PPO

## 2021-10-10 ENCOUNTER — Other Ambulatory Visit: Payer: Self-pay

## 2021-10-10 ENCOUNTER — Emergency Department (HOSPITAL_BASED_OUTPATIENT_CLINIC_OR_DEPARTMENT_OTHER)
Admission: EM | Admit: 2021-10-10 | Discharge: 2021-10-10 | Disposition: A | Discharge status: Home / Self Care | Payer: BC Managed Care – PPO | Attending: Emergency Medicine | Admitting: Emergency Medicine

## 2021-10-10 DIAGNOSIS — I10 Essential (primary) hypertension: Secondary | ICD-10-CM | POA: Diagnosis not present

## 2021-10-10 DIAGNOSIS — R1013 Epigastric pain: Secondary | ICD-10-CM | POA: Diagnosis present

## 2021-10-10 MED ORDER — ONDANSETRON HCL 4 MG/2ML IJ SOLN
4.0000 mg | Freq: Once | INTRAMUSCULAR | Status: AC
  Administered 2021-10-10: 4 mg via INTRAVENOUS
  Filled 2021-10-10: qty 2

## 2021-10-10 MED ORDER — MORPHINE SULFATE (PF) 4 MG/ML IV SOLN
4.0000 mg | Freq: Once | INTRAVENOUS | Status: AC
  Administered 2021-10-10: 4 mg via INTRAVENOUS
  Filled 2021-10-10: qty 1

## 2021-10-10 MED ORDER — IOHEXOL 300 MG/ML  SOLN
100.0000 mL | Freq: Once | INTRAMUSCULAR | Status: AC | PRN
  Administered 2021-10-10: 100 mL via INTRAVENOUS

## 2021-10-10 MED ORDER — SODIUM CHLORIDE 0.9 % IV BOLUS
1000.0000 mL | Freq: Once | INTRAVENOUS | Status: AC
Start: 2021-10-10 — End: 2021-10-10
  Administered 2021-10-10: 1000 mL via INTRAVENOUS

## 2021-10-10 NOTE — ED Triage Notes (Signed)
Patient coming to ED for evaluation of abdominal pain.  Reports she was seen at "the [facility] and told to come here early to have a CT because of the pain."  States CT was not done.  Pain is worse.  No reports of diarrhea or fevers.

## 2021-10-10 NOTE — ED Provider Notes (Signed)
[facility] EMERGENCY DEPT Provider Note   CSN: [id number] Arrival date & time: 10/10/21  0121     History  Chief Complaint  Patient presents with   Abdominal Pain    [name] is a 36 y.o. female.  Patient is a 36 year old female with past medical history of celiac disease, GERD, pulmonary hypertension, migraines.  Patient presenting today for evaluation of abdominal pain.  She reports pain to the left side of her abdomen and epigastric region now radiating to her back.  This has been ongoing for the past few days.  She went to a walk-in clinic yesterday afternoon and was told to come to the ER for laboratory studies and imaging.  Her laboratory studies were reassuring and the provider did not feel as though a CT scan was indicated at that time.  She was discharged, but now returns with worsening pain.  She denies to me she is having any vomiting, diarrhea, or constipation.  She denies any urinary complaints.  She denies any vaginal bleeding or discharge.  The history is provided by the patient.       Home Medications Prior to Admission medications   Medication Sig Start Date End Date Taking? Authorizing Provider  Cholecalciferol (VITAMIN D3) 2000 units capsule Take 2,000 Units by mouth daily.    [provider]  EPINEPHrine 0.3 mg/0.3 mL IJ SOAJ injection Inject 0.3 mg into the muscle as needed for anaphylaxis.  07/27/17   [provider]  famotidine (PEPCID) 20 MG tablet Take 20 mg by mouth 2 (two) times daily.    [provider]  guaiFENesin-codeine 100-10 MG/5ML syrup Take 10 mLs by mouth every 6 (six) hours as needed for cough. 02/23/20   [provider]  hydrocortisone cream 1 % Apply topically 3 (three) times daily. 09/09/18   Juliene Pina, CNM  ibuprofen (ADVIL) 800 MG tablet Take 1 tablet (800 mg total) by mouth every 8 (eight) hours. 09/09/18   Juliene Pina, CNM  Magnesium 400 MG CAPS Take 1 tablet by mouth daily.     [provider]  montelukast (SINGULAIR) 10 MG tablet Take 10 mg by mouth daily. 02/07/20   [provider]  Prenatal Vit-Fe Fumarate-FA (PRENATAL VITAMIN PO) Take 1 tablet by mouth daily.    [provider]  PROAIR RESPICLICK 696 (90 Base) MCG/ACT AEPB Inhale 2 puffs into the lungs every 4 (four) hours as needed. 10/15/16   [provider]  simethicone (MYLICON) 80 MG chewable tablet Chew 1 tablet (80 mg total) by mouth as needed for flatulence. 09/09/18   Juliene Pina, CNM  TIROSINT 75 MCG CAPS Take 75 mcg by mouth every morning. 02/23/20   [provider]  fluvoxaMINE (LUVOX) 100 MG tablet Take 100 mg by mouth 2 (two) times daily. 03/04/20 03/11/20  [provider]      Allergies    Avelox [moxifloxacin], Cephalosporins, Food, Penicillins, Sulfa antibiotics, Coconut (cocos nucifera), Cyclobenzaprine, Wheat bran, and Biaxin [clarithromycin]    Review of Systems   Review of Systems  All other systems reviewed and are negative.   Physical Exam Updated Vital Signs BP (!) 143/89 (BP Location: Right Arm)   Pulse 66   Temp 98.2 F (36.8 C) (Oral)   Resp 16   Ht 5' 10"  (1.778 m)   Wt 118.8 kg   SpO2 100%   BMI 37.59 kg/m  Physical Exam Vitals and nursing note reviewed.  Constitutional:      General:  She is not in acute distress.    Appearance: She is well-developed. She is not diaphoretic.  HENT:     Head: Normocephalic and atraumatic.  Cardiovascular:     Rate and Rhythm: Normal rate and regular rhythm.     Heart sounds: No murmur heard.    No friction rub. No gallop.  Pulmonary:     Effort: Pulmonary effort is normal. No respiratory distress.     Breath sounds: Normal breath sounds. No wheezing.  Abdominal:     General: Bowel sounds are normal. There is no distension.     Palpations: Abdomen is soft.     Tenderness: There is abdominal tenderness in the epigastric area and left upper quadrant. There is no right CVA tenderness,  left CVA tenderness, guarding or rebound.  Musculoskeletal:        General: Normal range of motion.     Cervical back: Normal range of motion and neck supple.  Skin:    General: Skin is warm and dry.  Neurological:     General: No focal deficit present.     Mental Status: She is alert and oriented to person, place, and time.     ED Results / Procedures / Treatments   Labs (all labs ordered are listed, but only abnormal results are displayed) Labs Reviewed - No data to display  EKG None  Radiology No results found.  Procedures Procedures    Medications Ordered in ED Medications  sodium chloride 0.9 % bolus 1,000 mL (has no administration in time range)  ondansetron (ZOFRAN) injection 4 mg (has no administration in time range)  morphine (PF) 4 MG/ML injection 4 mg (has no administration in time range)    ED Course/ Medical Decision Making/ A&P  This patient presents to the ED for concern of abdominal pain, this involves an extensive number of treatment options, and is a complaint that carries with it a high risk of complications and morbidity.  The differential diagnosis includes small obstruction, pancreatitis, acute cholecystitis, diverticulitis   Co morbidities that complicate the patient evaluation  None   Additional history obtained:  No additional history or external records needed   Lab Tests:  I Ordered, and personally interpreted labs.  The pertinent results include: Unremarkable CBC, CMP, and lipase   Imaging Studies ordered:  I ordered imaging studies including CT scan of the abdomen and pelvis I independently visualized and interpreted imaging which showed no acute process I agree with the radiologist interpretation   Cardiac Monitoring: / EKG:  None performed   Consultations Obtained:  No consultations needed   Problem List / ED Course / Critical interventions / Medication management  Patient presenting today with complaints of  abdominal pain as described in the HPI.  She has a history of celiac disease.  Laboratory studies from earlier today unremarkable and patient was discharged, but returns with worsening pain.  CT scan was ordered showing no acute intra-abdominal process.  I am uncertain as to the etiology of her discomfort, but nothing today appears emergent.  Patient to be discharged with outpatient follow-up I ordered medication including morphine and Zofran for pain and nausea Reevaluation of the patient after these medicines showed that the patient improved I have reviewed the patients home medicines and have made adjustments as needed   Social Determinants of Health:  None   Test / Admission - Considered:  Work-up fails to reveal a condition that would necessitate admission.  Patient to be discharged with as needed return.  Final Clinical Impression(s) / ED Diagnoses Final diagnoses:  None    Rx / DC Orders ED Discharge Orders     None         Veryl Speak, MD 10/10/21 [phone]

## 2021-10-10 NOTE — Discharge Instructions (Signed)
Continue medications as previously prescribed.  Return to the emergency department if symptoms significantly worsen or change.

## 2021-10-11 ENCOUNTER — Emergency Department (HOSPITAL_COMMUNITY)
Admission: EM | Admit: 2021-10-11 | Discharge: 2021-10-11 | Disposition: A | Discharge status: Home / Self Care | Payer: BC Managed Care – PPO | Attending: Emergency Medicine | Admitting: Emergency Medicine

## 2021-10-11 ENCOUNTER — Other Ambulatory Visit: Payer: Self-pay

## 2021-10-11 ENCOUNTER — Encounter (HOSPITAL_COMMUNITY): Payer: Self-pay

## 2021-10-11 DIAGNOSIS — R1084 Generalized abdominal pain: Secondary | ICD-10-CM

## 2021-10-11 DIAGNOSIS — I10 Essential (primary) hypertension: Secondary | ICD-10-CM | POA: Diagnosis not present

## 2021-10-11 DIAGNOSIS — Z79899 Other long term (current) drug therapy: Secondary | ICD-10-CM | POA: Insufficient documentation

## 2021-10-11 LAB — BASIC METABOLIC PANEL
Anion gap: 6 (ref 5–15)
BUN: 6 mg/dL (ref 6–20)
CO2: 24 mmol/L (ref 22–32)
Calcium: 8.6 mg/dL — ABNORMAL LOW (ref 8.9–10.3)
Chloride: 106 mmol/L (ref 98–111)
Creatinine, Ser: 0.78 mg/dL (ref 0.44–1.00)
GFR, Estimated: >60 mL/min (ref >60)
Glucose, Bld: 109 mg/dL — ABNORMAL HIGH (ref 70–99)
Potassium: 3.8 mmol/L (ref 3.5–5.1)
Sodium: 136 mmol/L (ref 135–145)

## 2021-10-11 LAB — CBC
HCT: 38.4 % (ref 36.0–46.0)
Hemoglobin: 12.7 g/dL (ref 12.0–15.0)
MCH: 30.4 pg (ref 26.0–34.0)
MCHC: 33.1 g/dL (ref 30.0–36.0)
MCV: 91.9 fL (ref 80.0–100.0)
Platelets: 220 10*3/uL (ref 150–400)
RBC: 4.18 MIL/uL (ref 3.87–5.11)
RDW: 13.2 % (ref 11.5–15.5)
WBC: 8.1 10*3/uL (ref 4.0–10.5)
nRBC: 0 % (ref 0.0–0.2)

## 2021-10-11 LAB — LIPASE, BLOOD: Lipase: 25 U/L (ref 11–51)

## 2021-10-11 MED ORDER — ALUM & MAG HYDROXIDE-SIMETH 200-200-20 MG/5ML PO SUSP
30.0000 mL | Freq: Once | ORAL | Status: AC
  Administered 2021-10-11: 30 mL via ORAL
  Filled 2021-10-11: qty 30

## 2021-10-11 MED ORDER — LIDOCAINE VISCOUS HCL 2 % MT SOLN
15.0000 mL | Freq: Once | OROMUCOSAL | Status: AC
  Administered 2021-10-11: 15 mL via ORAL
  Filled 2021-10-11: qty 15

## 2021-10-11 MED ORDER — DICYCLOMINE HCL 20 MG PO TABS: 20.0000 mg | ORAL_TABLET | Freq: Two times a day (BID) | ORAL | 0 refills | Status: DC | PRN

## 2021-10-11 MED ORDER — MORPHINE SULFATE (PF) 4 MG/ML IV SOLN
4.0000 mg | Freq: Once | INTRAVENOUS | Status: AC
  Administered 2021-10-11: 4 mg via INTRAVENOUS
  Filled 2021-10-11: qty 1

## 2021-10-11 MED ORDER — DICYCLOMINE HCL 10 MG PO CAPS
10.0000 mg | ORAL_CAPSULE | Freq: Once | ORAL | Status: AC
  Administered 2021-10-11: 10 mg via ORAL
  Filled 2021-10-11: qty 1

## 2021-10-11 NOTE — ED Notes (Signed)
Pt verbalized understanding of discharge instructions. Opportunity for questions provided.  

## 2021-10-11 NOTE — ED Triage Notes (Signed)
Pt presents to ED with complaints right sided abdominal pain. Pt states she has been to [facility] twice this weekend, was told it was Celiac flare.

## 2021-10-11 NOTE — ED Notes (Signed)
ED Provider at bedside. 

## 2021-10-11 NOTE — ED Notes (Signed)
Pt c/o generalized abdominal pain and nausea x3 days.

## 2021-10-11 NOTE — ED Provider Notes (Signed)
[facility] EMERGENCY DEPARTMENT Provider Note   CSN: [id number] Arrival date & time: 10/11/21  1227     History  Chief Complaint  Patient presents with   Abdominal Pain    [name] is a 36 y.o. female with past medical history significant for celiac disease, GERD, pulmonary hypertension, obesity, intermittent asthma who presents with concern for ongoing abdominal pain.  Patient has been seen twice at [facility] over the last 2 days CT angio chest, initially told by the seem to be a celiac flare, she has been taking over-the-counter Mylanta, as well as hyoscamine, zofran. Called on-call GI this morning and told to double medications at home.  Reports she cannot take NSAIDs, has been taking 2 extra strength Tylenol every 4-6 hours.  Patient reports the pain is not significantly worsened or changed but is migrating towards her right lower quadrant since she was last seen.  She reports that she does not have nausea at this time because she is already taking Zofran prior to arrival.  She has any diarrhea, constipation.   Abdominal Pain      Home Medications Prior to Admission medications   Medication Sig Start Date End Date Taking? Authorizing Provider  alum & mag hydroxide-simeth (MAALOX/MYLANTA) 200-200-20 MG/5ML suspension Take 15 mLs by mouth every 6 (six) hours as needed for indigestion or heartburn.   Yes [provider]  dicyclomine (BENTYL) 20 MG tablet Take 1 tablet (20 mg total) by mouth 2 (two) times daily as needed for spasms. 10/11/21  Yes Makilah Dowda H, PA-C  EPINEPHrine 0.3 mg/0.3 mL IJ SOAJ injection Inject 0.3 mg into the muscle as needed for anaphylaxis.  07/27/17  Yes [provider]  escitalopram (LEXAPRO) 10 MG tablet Take 10 mg by mouth daily. 06/10/21  Yes [provider]  famotidine (PEPCID) 20 MG tablet Take 20 mg by mouth 2 (two) times daily.   Yes [provider]  hyoscyamine (LEVSIN SL) 0.125 MG SL tablet Place  0.25 mg under the tongue every 6 (six) hours as needed for cramping.   Yes [provider]  ondansetron (ZOFRAN) 4 MG tablet Take 8 mg by mouth every 8 (eight) hours as needed for nausea or vomiting.   Yes [provider]  pantoprazole (PROTONIX) 40 MG tablet Take 40 mg by mouth 2 (two) times daily.   Yes [provider]  PROAIR RESPICLICK 828 (90 Base) MCG/ACT AEPB Inhale 2 puffs into the lungs every 4 (four) hours as needed. 10/15/16  Yes [provider]  sodium chloride (OCEAN) 0.65 % SOLN nasal spray Place 1 spray into both nostrils as needed for congestion.   Yes [provider]  TIROSINT 75 MCG CAPS Take 75 mcg by mouth every morning. 02/23/20  Yes [provider]  traZODone (DESYREL) 50 MG tablet Take 25-50 mg by mouth at bedtime. 09/06/21  Yes [provider]  Wound Dressings ([employer] MANUKA HONEY WOUND EX) Apply 1 Application topically daily. For wound care   Yes [provider]  fluvoxaMINE (LUVOX) 100 MG tablet Take 100 mg by mouth 2 (two) times daily. 03/04/20 03/11/20  [provider]      Allergies    Avelox [moxifloxacin], Bee venom, Cephalosporins, Food, Penicillins, Sulfa antibiotics, Wheat bran, and Biaxin [clarithromycin]    Review of Systems   Review of Systems  Gastrointestinal:  Positive for abdominal pain.  All other systems reviewed and are negative.   Physical Exam Updated Vital Signs BP 116/79   Pulse Marland Kitchen)  54   Temp 97.7 F (36.5 C)   Resp 16   Ht 5' 10"  (1.778 m)   Wt 118.8 kg   SpO2 100%   BMI 37.59 kg/m  Physical Exam Vitals and nursing note reviewed.  Constitutional:      General: She is not in acute distress.    Appearance: Normal appearance.  HENT:     Head: Normocephalic and atraumatic.  Eyes:     General:        Right eye: No discharge.        Left eye: No discharge.  Cardiovascular:     Rate and Rhythm: Normal rate and regular rhythm.     Heart sounds: No murmur heard.     No friction rub. No gallop.  Pulmonary:     Effort: Pulmonary effort is normal.     Breath sounds: Normal breath sounds.  Abdominal:     General: Bowel sounds are normal.     Palpations: Abdomen is soft.     Comments: Patient with generalized tenderness throughout the abdomen, most focally in the right lower quadrant but not specifically at McBurney's point.  No rebound, rigidity, guarding this is, of no abdominal distention, no ecchymosis noted.  Normal discomfort noted during abdominal exam.  Skin:    General: Skin is warm and dry.     Capillary Refill: Capillary refill takes less than 2 seconds.  Neurological:     Mental Status: She is alert and oriented to person, place, and time.  Psychiatric:        Mood and Affect: Mood normal.        Behavior: Behavior normal.     ED Results / Procedures / Treatments   Labs (all labs ordered are listed, but only abnormal results are displayed) Labs Reviewed  BASIC METABOLIC PANEL - Abnormal; Notable for the following components:      Result Value   Glucose, Bld 109 (*)    Calcium 8.6 (*)    All other components within normal limits  CBC  LIPASE, BLOOD  URINALYSIS, ROUTINE W REFLEX MICROSCOPIC    EKG None  Radiology CT ABDOMEN PELVIS W CONTRAST  Result Date: 10/10/2021 CLINICAL DATA:  Abdominal pain with nausea. History of celiac disease. EXAM: CT ABDOMEN AND PELVIS WITH CONTRAST TECHNIQUE: Multidetector CT imaging of the abdomen and pelvis was performed using the standard protocol following bolus administration of intravenous contrast. RADIATION DOSE REDUCTION: This exam was performed according to the departmental dose-optimization program which includes automated exposure control, adjustment of the mA and/or kV according to patient size and/or use of iterative reconstruction technique. CONTRAST:  135m OMNIPAQUE IOHEXOL 300 MG/ML  SOLN COMPARISON:  10/21/2017 FINDINGS: Lower chest: Unremarkable. Hepatobiliary: No suspicious focal  abnormality within the liver parenchyma. There is no evidence for gallstones, gallbladder wall thickening, or pericholecystic fluid. No intrahepatic or extrahepatic biliary dilation. Pancreas: No focal mass lesion. No dilatation of the main duct. No intraparenchymal cyst. No peripancreatic edema. Spleen: No splenomegaly. No focal mass lesion. Adrenals/Urinary Tract: No adrenal nodule or mass. Kidneys unremarkable. No evidence for hydroureter. The urinary bladder appears normal for the degree of distention. Stomach/Bowel: Stomach is unremarkable. No gastric wall thickening. No evidence of outlet obstruction. Duodenum is normally positioned as is the ligament of Treitz. No small bowel wall thickening. No small bowel dilatation. Appendix measures 6-7 mm diameter, upper normal without appreciable periappendiceal edema or inflammation. Appendix measured up to 8 mm diameter on the prior study. No gross colonic mass. No  colonic wall thickening. Vascular/Lymphatic: No abdominal aortic aneurysm. No abdominal aortic atherosclerotic calcification. There is no gastrohepatic or hepatoduodenal ligament lymphadenopathy. No retroperitoneal or mesenteric lymphadenopathy. No pelvic sidewall lymphadenopathy. Reproductive: The uterus is unremarkable.  There is no adnexal mass. Other: No intraperitoneal free fluid. Musculoskeletal: No worrisome lytic or sclerotic osseous abnormality. IMPRESSION: No acute findings in the abdomen or pelvis. Specifically, no findings to explain the patient's history of abdominal pain with nausea and vomiting. Electronically Signed   By: Misty Stanley M.D.   On: 10/10/2021 05:21    Procedures Procedures    Medications Ordered in ED Medications  alum & mag hydroxide-simeth (MAALOX/MYLANTA) 200-200-20 MG/5ML suspension 30 mL (30 mLs Oral Given 10/11/21 1422)    And  lidocaine (XYLOCAINE) 2 % viscous mouth solution 15 mL (15 mLs Oral Given 10/11/21 1422)  dicyclomine (BENTYL) capsule 10 mg (10 mg Oral  Given 10/11/21 1422)  morphine (PF) 4 MG/ML injection 4 mg (4 mg Intravenous Given 10/11/21 1423)    ED Course/ Medical Decision Making/ A&P                           Medical Decision Making  This patient is a 36 y.o. female who presents to the ED for concern of ongoing abdominal pain, cramping, localized in the right flank, right lower quadrant, this involves an extensive number of treatment options, and is a complaint that carries with it a high risk of complications and morbidity. The emergent differential diagnosis prior to evaluation includes, but is not limited to, patient started ongoing celiac flare, acid reflux flare, irritable bowel disease, appendicitis, cystitis, pyelonephritis, PID, TOA, ovarian torsion, ectopic pregnancy less likely with patient having recent negative pregnancy test , versus other.   This is not an exhaustive differential.   Past Medical History / Co-morbidities / Social History: History of celiac disease, acid reflux, anxiety, hypertension, obesity, tobacco abuse  Additional history: Chart reviewed. Pertinent results include: extensively reviewed recent previous emergency department visits, lab work, imaging from the last 2 days for similar complaint  Physical Exam: Physical exam performed. The pertinent findings include: Patient with some generalized abdominal tenderness, with most focal tenderness noted, epigastric and right lower quadrant, however pain is not localized to McBurney's point on my exam.  No rebound, rigidity, guarding   Lab Tests: I ordered, and personally interpreted labs.  The pertinent results include: Repeated basic lab work including CBC, BMP, lipase to ensure that patient not developing significant leukocytosis or other electrolyte abnormalities since her last evaluation.  Her lab work is reassuring today, BMP with mild high per glycemia with nonfasting lab values, calcium mildly decreased at 8.6, normal lipase, CBC unremarkable, notably no  leukocytosis.  She is not having any urinary symptoms at this time, do not think that she needs to wait for urinalysis based on unremarkable results 2 days ago.   Medications: I ordered medication including morphine, GI cocktail, Bentyl for abdominal pain, cramping. Reevaluation of the patient after these medicines showed that the patient improved. I have reviewed the patients home medicines and have made adjustments as needed.   Disposition: After consideration of the diagnostic results and the patients response to treatment, I feel that at this time patient has been extensively worked up over the last 3 days in the emergency department for ongoing abdominal cramping.  I see no evidence of any acute, infectious, or surgical process, my suspicion is that she is having some abdominal cramping, possibly  related to celiac flare, versus IBD versus other.  Encourage PCP, GI follow-up, and patient discharged in stable condition today with new prescription for Bentyl.   emergency department workup does not suggest an emergent condition requiring admission or immediate intervention beyond what has been performed at this time. The plan is: as above. The patient is safe for discharge and has been instructed to return immediately for worsening symptoms, change in symptoms or any other concerns.  I discussed this case with my attending physician Dr. Kathrynn Humble who cosigned this note including patient's presenting symptoms, physical exam, and planned diagnostics and interventions. Attending physician stated agreement with plan or made changes to plan which were implemented.    Final Clinical Impression(s) / ED Diagnoses Final diagnoses:  Generalized abdominal pain    Rx / DC Orders ED Discharge Orders          Ordered    dicyclomine (BENTYL) 20 MG tablet  2 times daily PRN        10/11/21 1530              Suezette Lafave, Beaver H, PA-C 10/11/21 [place], Ankit, MD 10/13/21 1230

## 2021-10-11 NOTE — Discharge Instructions (Signed)
Your work-up today was reassuring, I am not identified any new cause for your abdominal pain, your work-up does not suggest any severe intra-abdominal infection, given that you had a CT scan yesterday I do not think that you need a repeat imaging today.  I given you a prescription for medication that you can use in addition to the other medications that you have been prescribed.  You can use Bentyl as needed up to twice daily for abdominal cramping.  I would not double up with the hyoscyamine medication that you are have already prescribed as these work on the same mechanism and taking both of them and quick succession may lead to increased drowsiness, and other anticholinergic toxicity.  You can alternate medications if needed for severe pain, was at least 4 hours in between.  Please follow-up with your GI doctor, PCP early next week at earliest convenience to discuss your ongoing abdominal cramping.

## 2021-10-13 ENCOUNTER — Encounter (HOSPITAL_BASED_OUTPATIENT_CLINIC_OR_DEPARTMENT_OTHER): Payer: BC Managed Care – PPO | Admitting: Internal Medicine

## 2021-10-14 ENCOUNTER — Encounter (HOSPITAL_BASED_OUTPATIENT_CLINIC_OR_DEPARTMENT_OTHER): Payer: BC Managed Care – PPO | Attending: Internal Medicine | Admitting: Physician Assistant

## 2021-10-14 DIAGNOSIS — T24202A Burn of second degree of unspecified site of left lower limb, except ankle and foot, initial encounter: Secondary | ICD-10-CM | POA: Diagnosis present

## 2021-10-14 DIAGNOSIS — J45909 Unspecified asthma, uncomplicated: Secondary | ICD-10-CM | POA: Insufficient documentation

## 2021-10-14 DIAGNOSIS — K900 Celiac disease: Secondary | ICD-10-CM | POA: Insufficient documentation
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
## 2021-10-22 ENCOUNTER — Encounter (HOSPITAL_BASED_OUTPATIENT_CLINIC_OR_DEPARTMENT_OTHER): Payer: BC Managed Care – PPO | Admitting: Internal Medicine

## 2021-10-22 DIAGNOSIS — T24232A Burn of second degree of left lower leg, initial encounter: Secondary | ICD-10-CM

## 2021-10-22 DIAGNOSIS — A260 Cutaneous erysipeloid: Secondary | ICD-10-CM | POA: Diagnosis not present

## 2021-10-22 DIAGNOSIS — K900 Celiac disease: Secondary | ICD-10-CM

## 2021-10-22 DIAGNOSIS — T24202A Burn of second degree of unspecified site of left lower limb, except ankle and foot, initial encounter: Secondary | ICD-10-CM | POA: Diagnosis not present
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
## 2021-10-23 NOTE — Progress Notes (Signed)
AVA, TANGNEY ([id number]) Visit Report for 10/22/2021 Arrival Information Details Patient Name: Date of Service: [name], [name] 10/22/2021 12:45 PM Medical Record Number: [id number] Patient Account Number: [id number] Date of Birth/Sex: Treating RN: 1985/11/07 (36 y.o. [name], Meta.Reding Primary Care Jerelyn Trimarco: Lona Kettle Other Clinician: Referring Aeriana Speece: Treating Rayla Pember/Extender: Charlestine Massed in Treatment: 5 Visit Information History Since Last Visit Added or deleted any medications: No Patient Arrived: Ambulatory Any new allergies or adverse reactions: No Arrival Time: 12:50 Had a fall or experienced change in No Accompanied By: self activities of daily living that may affect Transfer Assistance: None risk of falls: Patient Identification Verified: Yes Signs or symptoms of abuse/neglect since last visito No Secondary Verification Process Completed: Yes Hospitalized since last visit: No Patient Requires Transmission-Based Precautions: No Implantable device outside of the clinic excluding No Patient Has Alerts: No cellular tissue based products placed in the center since last visit: Has Dressing in Place as Prescribed: Yes Pain Present Now: No Electronic Signature(s) Signed: 10/22/2021 5:58:31 PM By: Deon Pilling RN, BSN Entered By: Deon Pilling on 10/22/2021 12:52:59 -------------------------------------------------------------------------------- Clinic Level of Care Assessment Details Patient Name: Date of Service: [name], [name] 10/22/2021 12:45 PM Medical Record Number: [id number] Patient Account Number: [id number] Date of Birth/Sex: Treating RN: 1985/11/30 (36 y.o. [name], Meta.Reding Primary Care Joie Hipps: Lona Kettle Other Clinician: Referring Wesleigh Markovic: Treating Janean Eischen/Extender: Charlestine Massed in Treatment: 5 Clinic Level of Care Assessment Items TOOL 4 Quantity Score X- 1 0 Use when only an EandM is  performed on FOLLOW-UP visit ASSESSMENTS - Nursing Assessment / Reassessment X- 1 10 Reassessment of Co-morbidities (includes updates in patient status) X- 1 5 Reassessment of Adherence to Treatment Plan ASSESSMENTS - Wound and Skin A ssessment / Reassessment X - Simple Wound Assessment / Reassessment - one wound 1 5 []  - 0 Complex Wound Assessment / Reassessment - multiple wounds X- 1 10 Dermatologic / Skin Assessment (not related to wound area) ASSESSMENTS - Focused Assessment []  - 0 Circumferential Edema Measurements - multi extremities []  - 0 Nutritional Assessment / Counseling / Intervention []  - 0 Lower Extremity Assessment (monofilament, tuning fork, pulses) []  - 0 Peripheral Arterial Disease Assessment (using hand held doppler) ASSESSMENTS - Ostomy and/or Continence Assessment and Care []  - 0 Incontinence Assessment and Management []  - 0 Ostomy Care Assessment and Management (repouching, etc.) PROCESS - Coordination of Care X - Simple Patient / Family Education for ongoing care 1 15 []  - 0 Complex (extensive) Patient / Family Education for ongoing care X- 1 10 Staff obtains Programmer, systems, Records, T Results / Process Orders est []  - 0 Staff telephones HHA, Nursing Homes / Clarify orders / etc []  - 0 Routine Transfer to another Facility (non-emergent condition) []  - 0 Routine Hospital Admission (non-emergent condition) []  - 0 New Admissions / Biomedical engineer / Ordering NPWT Apligraf, etc. , []  - 0 Emergency Hospital Admission (emergent condition) X- 1 10 Simple Discharge Coordination []  - 0 Complex (extensive) Discharge Coordination PROCESS - Special Needs []  - 0 Pediatric / Minor Patient Management []  - 0 Isolation Patient Management []  - 0 Hearing / Language / Visual special needs []  - 0 Assessment of Community assistance (transportation, D/C planning, etc.) []  - 0 Additional assistance / Altered mentation []  - 0 Support Surface(s) Assessment  (bed, cushion, seat, etc.) INTERVENTIONS - Wound Cleansing / Measurement X - Simple Wound Cleansing - one wound 1 5 []  - 0 Complex Wound Cleansing -  multiple wounds X- 1 5 Wound Imaging (photographs - any number of wounds) []  - 0 Wound Tracing (instead of photographs) X- 1 5 Simple Wound Measurement - one wound []  - 0 Complex Wound Measurement - multiple wounds INTERVENTIONS - Wound Dressings []  - 0 Small Wound Dressing one or multiple wounds []  - 0 Medium Wound Dressing one or multiple wounds []  - 0 Large Wound Dressing one or multiple wounds []  - 0 Application of Medications - topical []  - 0 Application of Medications - injection INTERVENTIONS - Miscellaneous []  - 0 External ear exam []  - 0 Specimen Collection (cultures, biopsies, blood, body fluids, etc.) []  - 0 Specimen(s) / Culture(s) sent or taken to Lab for analysis []  - 0 Patient Transfer (multiple staff / Civil Service fast streamer / Similar devices) []  - 0 Simple Staple / Suture removal (25 or less) []  - 0 Complex Staple / Suture removal (26 or more) []  - 0 Hypo / Hyperglycemic Management (close monitor of Blood Glucose) []  - 0 Ankle / Brachial Index (ABI) - do not check if billed separately X- 1 5 Vital Signs Has the patient been seen at the hospital within the last three years: Yes Total Score: 85 Level Of Care: New/Established - Level 3 Electronic Signature(s) Signed: 10/22/2021 5:58:31 PM By: Deon Pilling RN, BSN Entered By: Deon Pilling on 10/22/2021 13:01:44 -------------------------------------------------------------------------------- Encounter Discharge Information Details Patient Name: Date of Service: [name], [name] 10/22/2021 12:45 PM Medical Record Number: [id number] Patient Account Number: [id number] Date of Birth/Sex: Treating RN: 1985-05-08 (36 y.o. [name] Primary Care Ramesses Crampton: Lona Kettle Other Clinician: Referring Montrice Gracey: Treating Jaylah Goodlow/Extender: Charlestine Massed in Treatment: 5 Encounter Discharge Information Items Discharge Condition: Stable Ambulatory Status: Ambulatory Discharge Destination: Home Transportation: Private Auto Accompanied By: self Schedule Follow-up Appointment: No Clinical Summary of Care: Electronic Signature(s) Signed: 10/22/2021 5:58:31 PM By: Deon Pilling RN, BSN Entered By: Deon Pilling on 10/22/2021 13:02:10 -------------------------------------------------------------------------------- Lower Extremity Assessment Details Patient Name: Date of Service: [name], [name] 10/22/2021 12:45 PM Medical Record Number: [id number] Patient Account Number: [id number] Date of Birth/Sex: Treating RN: [birth year] (36 y.o. [name] Primary Care Jaylei Fuerte: Lona Kettle Other Clinician: Referring Gabryela Kimbrell: Treating Cristian Davitt/Extender: Charlestine Massed in Treatment: 5 Edema Assessment Assessed: [Left: Yes] [Right: No] Edema: [Left: N] [Right: o] Calf Left: Right: Point of Measurement: 32 cm From Medial Instep 42 cm Ankle Left: Right: Point of Measurement: 9 cm From Medial Instep 24 cm Vascular Assessment Pulses: Dorsalis Pedis Palpable: [Left:Yes] Electronic Signature(s) Signed: 10/22/2021 5:58:31 PM By: Deon Pilling RN, BSN Entered By: Deon Pilling on 10/22/2021 12:55:05 -------------------------------------------------------------------------------- Multi Wound Chart Details Patient Name: Date of Service: [name], [name] 10/22/2021 12:45 PM Medical Record Number: [id number] Patient Account Number: [id number] Date of Birth/Sex: Treating RN: 14-Sep-1985 (36 y.o. [name] Primary Care Javaya [place]: Lona Kettle Other Clinician: Referring Pailyn Bellevue: Treating Meztli Llanas/Extender: Charlestine Massed in Treatment: 5 Vital Signs Height(in): 70 Pulse(bpm): 101 Weight(lbs): 262 Blood Pressure(mmHg): 132/83 Body Mass Index(BMI): 37.6 Temperature(F):  99 Respiratory Rate(breaths/min): 20 Photos: [N/A:N/A] Left, Lateral Lower Leg N/A N/A Wound Location: Thermal Burn N/A N/A Wounding Event: 2nd degree Burn N/A N/A Primary Etiology: Asthma N/A N/A Comorbid History: 07/02/2021 N/A N/A Date Acquired: 5 N/A N/A Weeks of Treatment: Healed - Epithelialized N/A N/A Wound Status: No N/A N/A Wound Recurrence: 0x0x0 N/A N/A Measurements L x W x D (cm) 0 N/A N/A A (cm) : rea 0 N/A N/A  Volume (cm) : 100.00% N/A N/A % Reduction in A rea: 100.00% N/A N/A % Reduction in Volume: Full Thickness Without Exposed N/A N/A Classification: Support Structures None Present N/A N/A Exudate Amount: Distinct, outline attached N/A N/A Wound Margin: None Present (0%) N/A N/A Granulation Amount: None Present (0%) N/A N/A Necrotic Amount: Fascia: No N/A N/A Exposed Structures: Fat Layer (Subcutaneous Tissue): No Tendon: No Muscle: No Joint: No Bone: No Large (67-100%) N/A N/A Epithelialization: Treatment Notes Electronic Signature(s) Signed: 10/22/2021 5:58:31 PM By: Deon Pilling RN, BSN Signed: 10/23/2021 9:20:47 AM By: Kalman Shan DO Entered By: Kalman Shan on 10/22/2021 13:21:34 -------------------------------------------------------------------------------- Multi-Disciplinary Care Plan Details Patient Name: Date of Service: [name], [name] 10/22/2021 12:45 PM Medical Record Number: [id number] Patient Account Number: [id number] Date of Birth/Sex: Treating RN: 1985/03/13 (36 y.o. [name] Primary Care Nicholette Dolson: Lona Kettle Other Clinician: Referring Braydyn Schultes: Treating Aletta Edmunds/Extender: Charlestine Massed in Treatment: 5 Active Inactive Electronic Signature(s) Signed: 10/22/2021 5:58:31 PM By: Deon Pilling RN, BSN Entered By: Deon Pilling on 10/22/2021 13:01:09 -------------------------------------------------------------------------------- Pain Assessment Details Patient  Name: Date of Service: [name], [name] 10/22/2021 12:45 PM Medical Record Number: [id number] Patient Account Number: [id number] Date of Birth/Sex: Treating RN: 20-Sep-1985 (36 y.o. [name] Primary Care Vangie Henthorn: Lona Kettle Other Clinician: Referring Shelbie Franken: Treating Braylyn Kalter/Extender: Charlestine Massed in Treatment: 5 Active Problems Location of Pain Severity and Description of Pain Patient Has Paino No Site Locations Rate the pain. Current Pain Level: 0 Pain Management and Medication Current Pain Management: Medication: No Cold Application: No Rest: No Massage: No Activity: No T.E.N.S.: No Heat Application: No Leg drop or elevation: No Is the Current Pain Management Adequate: Adequate How does your wound impact your activities of daily livingo Sleep: No Bathing: No Appetite: No Relationship With Others: No Bladder Continence: No Emotions: No Bowel Continence: No Work: No Toileting: No Drive: No Dressing: No Hobbies: No Electronic Signature(s) Signed: 10/22/2021 5:58:31 PM By: Deon Pilling RN, BSN Entered By: Deon Pilling on 10/22/2021 12:53:20 -------------------------------------------------------------------------------- Wound Assessment Details Patient Name: Date of Service: [name], [name] 10/22/2021 12:45 PM Medical Record Number: [id number] Patient Account Number: [id number] Date of Birth/Sex: Treating RN: 01/19/1986 (36 y.o. [name] Primary Care Anyely Cunning: Lona Kettle Other Clinician: Referring Khyla Mccumbers: Treating Samaa Ueda/Extender: Charlestine Massed in Treatment: 5 Wound Status Wound Number: 1 Primary Etiology: 2nd degree Burn Wound Location: Left, Lateral Lower Leg Wound Status: Healed - Epithelialized Wounding Event: Thermal Burn Comorbid History: Asthma Date Acquired: 07/02/2021 Weeks Of Treatment: 5 Clustered Wound: No Photos Wound Measurements Length: (cm) Width:  (cm) Depth: (cm) Area: (cm) Volume: (cm) 0 % Reduction in Area: 100% 0 % Reduction in Volume: 100% 0 Epithelialization: Large (67-100%) 0 Tunneling: No 0 Undermining: No Wound Description Classification: Full Thickness Without Exposed Support Stru Wound Margin: Distinct, outline attached Exudate Amount: None Present ctures Foul Odor After Cleansing: No Slough/Fibrino No Wound Bed Granulation Amount: None Present (0%) Exposed Structure Necrotic Amount: None Present (0%) Fascia Exposed: No Fat Layer (Subcutaneous Tissue) Exposed: No Tendon Exposed: No Muscle Exposed: No Joint Exposed: No Bone Exposed: No Electronic Signature(s) Signed: 10/22/2021 5:58:31 PM By: Deon Pilling RN, BSN Entered By: Deon Pilling on 10/22/2021 13:00:29 -------------------------------------------------------------------------------- Vitals Details Patient Name: Date of Service: [name], [name] 10/22/2021 12:45 PM Medical Record Number: [id number] Patient Account Number: [id number] Date of Birth/Sex: Treating RN: 1985-12-28 (36 y.o. [name] Primary Care Puneet Selden: Lona Kettle  Other Clinician: Referring Sue Mcalexander: Treating Akela Pocius/Extender: Charlestine Massed in Treatment: 5 Vital Signs Time Taken: 12:50 Temperature (F): 99 Height (in): 70 Pulse (bpm): 101 Weight (lbs): 262 Respiratory Rate (breaths/min): 20 Body Mass Index (BMI): 37.6 Blood Pressure (mmHg): 132/83 Reference Range: 80 - 120 mg / dl Electronic Signature(s) Signed: 10/22/2021 5:58:31 PM By: Deon Pilling RN, BSN Entered By: Deon Pilling on 10/22/2021 12:53:12

## 2022-01-01 ENCOUNTER — Encounter: Payer: Self-pay | Admitting: Internal Medicine

## 2022-01-01 ENCOUNTER — Ambulatory Visit: Payer: BC Managed Care – PPO | Admitting: Internal Medicine

## 2022-01-01 ENCOUNTER — Other Ambulatory Visit: Payer: Self-pay | Admitting: Internal Medicine

## 2022-01-01 ENCOUNTER — Other Ambulatory Visit: Payer: Self-pay

## 2022-01-01 VITALS — BP 104/72 | HR 67 | Temp 98.3°F | Resp 16 | Ht 69.5 in | Wt 269.3 lb

## 2022-01-01 DIAGNOSIS — T63481A Toxic effect of venom of other arthropod, accidental (unintentional), initial encounter: Secondary | ICD-10-CM

## 2022-01-01 DIAGNOSIS — T782XXA Anaphylactic shock, unspecified, initial encounter: Secondary | ICD-10-CM

## 2022-01-01 DIAGNOSIS — J4530 Mild persistent asthma, uncomplicated: Secondary | ICD-10-CM

## 2022-01-01 DIAGNOSIS — H1013 Acute atopic conjunctivitis, bilateral: Secondary | ICD-10-CM

## 2022-01-01 DIAGNOSIS — J3089 Other allergic rhinitis: Secondary | ICD-10-CM | POA: Diagnosis not present

## 2022-01-01 DIAGNOSIS — J302 Other seasonal allergic rhinitis: Secondary | ICD-10-CM

## 2022-01-01 MED ORDER — OLOPATADINE HCL 0.2 % OP SOLN: 1.0000 [drp] | Freq: Every day | OPHTHALMIC | 5 refills | Status: AC | PRN

## 2022-01-01 MED ORDER — FLUTICASONE PROPIONATE HFA 110 MCG/ACT IN AERO: 2.0000 | INHALATION_SPRAY | Freq: Two times a day (BID) | RESPIRATORY_TRACT | 5 refills | Status: DC

## 2022-01-01 MED ORDER — FLUTICASONE PROPIONATE 50 MCG/ACT NA SUSP: 2.0000 | Freq: Every day | NASAL | 5 refills | Status: AC

## 2022-01-01 MED ORDER — MONTELUKAST SODIUM 10 MG PO TABS: 10.0000 mg | ORAL_TABLET | Freq: Once | ORAL | 0 refills | Status: AC

## 2022-01-01 MED ORDER — CETIRIZINE HCL 10 MG PO TABS: 10.0000 mg | ORAL_TABLET | Freq: Every day | ORAL | 5 refills | Status: AC

## 2022-01-01 MED ORDER — AZELASTINE HCL 0.1 % NA SOLN
1.0000 | Freq: Two times a day (BID) | NASAL | 5 refills | Status: AC
Start: 2022-01-01 — End: ?

## 2022-01-01 MED ORDER — EPINEPHRINE 0.3 MG/0.3ML IJ SOAJ
0.3000 mg | INTRAMUSCULAR | 1 refills | Status: AC | PRN
Start: 2022-01-01 — End: ?

## 2022-01-01 MED ORDER — ALBUTEROL SULFATE HFA 108 (90 BASE) MCG/ACT IN AERS: 2.0000 | INHALATION_SPRAY | Freq: Four times a day (QID) | RESPIRATORY_TRACT | 1 refills | Status: AC | PRN

## 2022-01-01 NOTE — Patient Instructions (Addendum)
Asthma: - MDI technique discussed.   - Maintenance inhaler: start Flovent 170mg 2 puffs twice daily with spacer. - Rescue inhaler: Albuterol 2 puffs via spacer or 1 vial via nebulizer every 4-6 hours as needed for respiratory symptoms of cough, shortness of breath, or wheezing Asthma control goals:  Full participation in all desired activities (may need albuterol before activity) Albuterol use two times or less a week on average (not counting use with activity) Cough interfering with sleep two times or less a month Oral steroids no more than once a year No hospitalizations   Rhinitis: - Positive skin test 01/2022: grasses, weeds, trees, mold, dust mite, cat hair - Avoidance measures discussed. - Use nasal saline rinses before nose sprays such as with Neilmed Sinus Rinse.  Use distilled water.   - Use Flonase 2 sprays each nostril daily. Aim upward and outward. - Use Azelastine 1-2 sprays each nostril twice daily as needed. Aim upward and outward. - Use Zyrtec 10 mg daily. - Use Singulair 116mdaily. Stop if there are any mood/behavioral changes. - For eyes, use Olopatadine or Ketotifen 1 eye drop daily as needed for itchy, watery eyes.  Available over the counter, if not covered by insurance.  - Consider allergy shots as long term control of your symptoms by teaching your immune system to be more tolerant of your allergy triggers   Concern For Stinging Insect Allergy: - based on today's history, will obtain labs for stinging insects - if negative, would consider confirming with skin testing - please carry Epinephrine autoinjector at all times in case of accidental sting, to be used if stung and has SKIN AND ANY OTHER SYMPTOMS - for SKIN only, can take Benadryl 5036m - practice avoidance measures as outlined below when possible    ALLERGEN AVOIDANCE MEASURES   Dust Mites Use central air conditioning and heat; and change the filter monthly.  Pleated filters work better than mesh  filters.  Electrostatic filters may also be used; wash the filter monthly.  Window air conditioners may be used, but do not clean the air as well as a central air conditioner.  Change or wash the filter monthly. Keep windows closed.  Do not use attic fans.   Encase the mattress, box springs and pillows with zippered, dust proof covers. Wash the bed linens in hot water weekly.   Remove carpet, especially from the bedroom. Remove stuffed animals, throw pillows, dust ruffles, heavy drapes and other items that collect dust from the bedroom. Do not use a humidifier.   Use wood, vinyl or leather furniture instead of cloth furniture in the bedroom. Keep the indoor humidity at 30 - 40%.  Monitor with a humidity gauge.  Molds - Indoor avoidance Use air conditioning to reduce indoor humidity.  Do not use a humidifier. Keep indoor humidity at 30 - 40%.  Use a dehumidifier if needed. In the bathroom use an exhaust fan or open a window after showering.  Wipe down damp surfaces after showering.  Clean bathrooms with a mold-killing solution (diluted bleach, or products like Tilex, etc) at least once a month. In the kitchen use an exhaust fan to remove steam from cooking.  Throw away spoiled foods immediately, and empty garbage daily.  Empty water pans below self-defrosting refrigerators frequently. Vent the clothes dryer to the outside. Limit indoor houseplants; mold grows in the dirt.  No houseplants in the bedroom. Remove carpet from the bedroom. Encase the mattress and box springs with a zippered encasing.  Molds - Outdoor avoidance Avoid being outside when the grass is being mowed, or the ground is tilled. Avoid playing in leaves, pine straw, hay, etc.  Dead plant materials contain mold. Avoid going into barns or grain storage areas. Remove leaves, clippings and compost from around the home. Pollen Avoidance Pollen levels are highest during the mid-day and afternoon.  Consider this when planning  outdoor activities. Avoid being outside when the grass is being mowed, or wear a mask if the pollen-allergic person must be the one to mow the grass. Keep the windows closed to keep pollen outside of the home. Use an air conditioner to filter the air. Take a shower, wash hair, and change clothing after working or playing outdoors during pollen season. Pet Dander Keep the pet out of your bedroom and restrict it to only a few rooms. Be advised that keeping the pet in only one room will not limit the allergens to that room. Don't pet, hug or kiss the pet; if you do, wash your hands with soap and water. High-efficiency particulate air (HEPA) cleaners run continuously in a bedroom or living room can reduce allergen levels over time. Regular use of a high-efficiency vacuum cleaner or a central vacuum can reduce allergen levels. Giving your pet a bath at least once a week can reduce airborne allergen.

## 2022-01-01 NOTE — Progress Notes (Signed)
NEW PATIENT  Date of Service/Encounter:  01/01/22  Consult requested by: Lawerance Cruel, MD   Subjective:   [name] (DOB: 1985-06-03) is a 36 y.o. female who presents to the clinic on 01/01/2022 with a chief complaint of Nasal Congestion (PATIENT STATED IT HAS BEEN SINCE SEPTEMBER.) and Cough .    History obtained from: chart review and patient. Of note, she does not have pulmonary hypertension.  There was some mistake with her report.   Asthma:  Diagnosed in childhood. It generally does well but it would flare up with respiratory illness requiring albuterol use.   However, she has had COVID multiple times and since then her flare ups have been worse.   She has a lot of coughing and trouble breathing with this.   She is requiring albuterol several days in a row almost every 2-3 weeks.   She is also having nighttime coughing. They tried Advair but it caused twitching of her hands.  Limitations to daily activity: mild 1 ED visits/UC visits and 4 oral steroids in the past year 0 number of lifetime hospitalizations, 0 number of lifetime intubations.  Identified Triggers: respiratory illness Prior PFTs or spirometry: yes but nothing recent  Current regimen:  Maintenance: None Rescue: Albuterol 2 puffs q4-6 hrs PRN  Rhinitis:  Started in childhood.  Previously on AIT in childhood. Symptoms include: nasal congestion, rhinorrhea, post nasal drainage, sneezing, watery eyes, and itchy eyes  Occurs year-round Potential triggers: not sure Treatments tried:  Ipratropium PRN Singulair daily Benadryl PRN  Previous allergy testing: yes in 2019 History of chronic sinusitis or sinus surgery: no  Concern for Stinging Insect Allergy: Insect of concern: stinging insect, possibly bee History of reaction:  She has had localized swelling followed by trouble breathing and throat closing.  She has other stings after the initial one and again had trouble breathing and throat  swelling with them.   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  Carries an epinephrine autoinjector: yes but is expired.  Past Medical History: Past Medical History:  Diagnosis Date   Anemia    Anxiety    Asthma    chronic bronchitis   Celiac disease    Cervical polyp    COVID    Endometrial polyp    Headache    Infection    UTI   Kidney stones    Past Surgical History: Past Surgical History:  Procedure Laterality Date   ADENOIDECTOMY     CESAREAN SECTION N/A 09/06/2018   Procedure: CESAREAN SECTION;  Surgeon: Aloha Gell, MD;  Location: [facility];  Service: Obstetrics;  Laterality: N/A;   DILATION AND CURETTAGE OF UTERUS  01/2017   polyp removed    FOOT SURGERY Left    HIP SURGERY     TONSILLECTOMY      Family History: Family History  Problem Relation Age of Onset   Transient ischemic attack Mother 62   Hyperlipidemia Mother 46   Mitral valve prolapse Mother    Thyroid disease Mother    Asthma Mother    Obesity Father    Hearing loss Father    Celiac disease Father    Celiac disease Brother    Diabetes Maternal Grandmother    Hyperlipidemia Maternal Grandmother    Kidney disease Maternal Grandmother    Skin cancer Paternal Grandfather    Heart disease Paternal Grandfather    Heart attack Maternal Grandfather 15   Emphysema Maternal Grandfather  smoked   Alpha-1 antitrypsin deficiency Maternal Grandfather    COPD Maternal Grandfather    Other Paternal Grandmother        neuroendocrine cancer   Skin cancer Paternal Grandmother    Hypertension Paternal Grandmother    Thyroid disease Sister    Cystic fibrosis Sister     Social History:  Lives in a 57 year house Flooring in bedroom: carpet Pets: none Tobacco use/exposure: quit about 5 years ago; smoked for 20 years. Maximum was 1 pack every 3 days.  Job: teacher  Medication List:  Allergies as of 01/01/2022       Reactions   Avelox  [moxifloxacin] Shortness Of Breath   Bee Venom Anaphylaxis   Cephalosporins Shortness Of Breath   CP Other reaction(s): Chest pain   Food Anaphylaxis   "pine nuts"   Penicillins Anaphylaxis   Sulfa Antibiotics Shortness Of Breath   Wheat Bran    Other reaction(s): Unknown   Biaxin [clarithromycin] Nausea And Vomiting        Medication List        Accurate as of January 01, 2022  4:01 PM. If you have any questions, ask your nurse or doctor.          alum & mag hydroxide-simeth 200-200-20 MG/5ML suspension Commonly known as: MAALOX/MYLANTA Take 15 mLs by mouth every 6 (six) hours as needed for indigestion or heartburn.   [employer] MANUKA HONEY WOUND EX Apply 1 Application topically daily. For wound care   dicyclomine 20 MG tablet Commonly known as: BENTYL Take 1 tablet (20 mg total) by mouth 2 (two) times daily as needed for spasms.   EPINEPHrine 0.3 mg/0.3 mL Soaj injection Commonly known as: EPI-PEN Inject 0.3 mg into the muscle as needed for anaphylaxis.   escitalopram 10 MG tablet Commonly known as: LEXAPRO Take 10 mg by mouth daily.   famotidine 20 MG tablet Commonly known as: PEPCID Take 20 mg by mouth 2 (two) times daily.   hyoscyamine 0.125 MG SL tablet Commonly known as: LEVSIN SL Place 0.25 mg under the tongue every 6 (six) hours as needed for cramping.   ipratropium 0.06 % nasal spray Commonly known as: ATROVENT Place 2 sprays into both nostrils 4 (four) times daily.   montelukast 10 MG tablet Commonly known as: SINGULAIR Take 10 mg by mouth once.   ondansetron 4 MG tablet Commonly known as: ZOFRAN Take 8 mg by mouth every 8 (eight) hours as needed for nausea or vomiting.   pantoprazole 40 MG tablet Commonly known as: PROTONIX Take 40 mg by mouth 2 (two) times daily.   ProAir RespiClick 226 (90 Base) MCG/ACT Aepb Generic drug: Albuterol Sulfate Inhale 2 puffs into the lungs every 4 (four) hours as needed.   sodium chloride 0.65 % Soln nasal  spray Commonly known as: OCEAN Place 1 spray into both nostrils as needed for congestion.   Tirosint 75 MCG Caps Generic drug: Levothyroxine Sodium Take 75 mcg by mouth every morning.   traZODone 50 MG tablet Commonly known as: DESYREL Take 25-50 mg by mouth at bedtime.         REVIEW OF SYSTEMS: Pertinent positives and negatives discussed in HPI.   Objective:   Physical Exam: BP 104/72   Pulse 67   Temp 98.3 F (36.8 C) (Temporal)   Resp 16   Ht 5' 9.5" (1.765 m)   Wt 269 lb 4.8 oz (122.2 kg)   SpO2 99%   BMI 39.20 kg/m  Body mass index  is 39.2 kg/m. GEN: alert, well developed HEENT: clear conjunctiva, TM grey and translucent, nose with + inferior turbinate hypertrophy, pink nasal mucosa, slight clear rhinorrhea, slight cobblestoning HEART: regular rate and rhythm, no murmur LUNGS: clear to auscultation bilaterally, no coughing, unlabored respiration ABDOMEN: soft, non distended  SKIN: no rashes or lesions  Reviewed:  08/2017: seen by Dr. 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   10/2021: seen by PCP for viral syndrome; had a cough, sore throat and was told to do at home care.   Spirometry:  Tracings reviewed. Her effort: Good reproducible efforts. FVC: 3.82L; post 3.96L FEV1: 2.55L, 72% predicted; post 2.73 L, 77% FEV1/FVC ratio: 67% Interpretation: Spirometry consistent with mild obstructive disease. Some reversibility.  Please see scanned spirometry results for details.  Skin Testing:  Skin prick testing was placed, which includes aeroallergens/foods, histamine control, and saline control.  Verbal consent was obtained prior to placing test.  Patient tolerated procedure well.  Allergy testing results were read and interpreted by myself, documented by clinical  staff. Adequate positive and negative control.  Results discussed with patient/family.  Airborne Adult Perc - 01/01/22 1500     Time Antigen Placed 1510    Allergen Manufacturer Lavella Hammock    Location Back    Number of Test 59    1. Control-Buffer 50% Glycerol Negative    2. Control-Histamine 1 mg/ml 3+    3. Albumin saline Negative    4. [place] Negative    5. [place] Negative    6. Johnson 3+    7. Kentucky Blue 3+    8. Meadow Fescue 3+    9. Perennial Rye 3+    10. Sweet Vernal 3+    11. Timothy Negative    12. Cocklebur Negative    13. Burweed Marshelder Negative    14. Ragweed, short 3+    15. Ragweed, Giant Negative    16. Plantain,  English Negative    17. Lamb's Quarters Negative    18. Sheep Sorrell Negative    19. Rough Pigweed Negative    20. Marsh Elder, Rough Negative    21. Mugwort, Common Negative    22. Ash mix Negative    23. Birch mix 3+    24. Beech American Negative    25. Box, Elder Negative    26. Cedar, red Negative    27. Cottonwood, [place] Negative    28. Elm mix Negative    29. Hickory 3+    30. Maple mix 3+    31. Oak, [place] mix Negative    32. Pecan Pollen Negative    33. Pine mix Negative    34. Sycamore Eastern Negative    35. Oracle, Black Pollen 2+    36. Alternaria alternata Negative    37. Cladosporium Herbarum Negative    38. Aspergillus mix Negative    39. Penicillium mix Negative    40. Bipolaris sorokiniana (Helminthosporium) Negative    41. Drechslera spicifera (Curvularia) Negative    42. Mucor plumbeus Negative    43. Fusarium moniliforme Negative    44. Aureobasidium pullulans (pullulara) Negative    45. Rhizopus oryzae Negative    46. Botrytis cinera Negative    47. Epicoccum nigrum Negative    48. Phoma betae Negative    49. Candida Albicans Negative    50. Trichophyton mentagrophytes Negative  51. Mite, D Farinae  5,000 AU/ml 4+    52. Mite, D Pteronyssinus  5,000 AU/ml 3+    53. Cat Hair 10,000 BAU/ml 3+    54.   Dog Epithelia Negative    55. Mixed Feathers Negative    56. Horse Epithelia Negative    57. Cockroach, German Negative    58. Mouse Negative    59. Tobacco Leaf Negative             Intradermal - 01/01/22 1527     Time Antigen Placed 1527    Allergen Manufacturer Lavella Hammock    Location Arm    Number of Test 8    Intradermal Select               Assessment:   1. Mild persistent asthma without complication   2. Anaphylaxis due to hymenoptera venom, accidental or unintentional, initial encounter   3. Seasonal and perennial allergic rhinitis   4. Allergic conjunctivitis of both eyes     Plan/Recommendations:   Mild Persistent Asthma, Uncontrolled  - MDI technique discussed.   - Maintenance inhaler: start Flovent 181mg 2 puffs twice daily with spacer. - Rescue inhaler: Albuterol 2 puffs via spacer or 1 vial via nebulizer every 4-6 hours as needed for respiratory symptoms of cough, shortness of breath, or wheezing Asthma control goals:  Full participation in all desired activities (may need albuterol before activity) Albuterol use two times or less a week on average (not counting use with activity) Cough interfering with sleep two times or less a month Oral steroids no more than once a year No hospitalizations   Allergic Rhinitis Allergic Conjunctivitis - Positive skin test 01/2022: grasses, weeds, trees, mold, dust mite, cat hair - Avoidance measures discussed. - Use nasal saline rinses before nose sprays such as with Neilmed Sinus Rinse.  Use distilled water.   - Use Flonase 2 sprays each nostril daily. Aim upward and outward. - Use Azelastine 1-2 sprays each nostril twice daily as needed. Aim upward and outward. - Use Zyrtec 10 mg daily. - Use Singulair 136mdaily. Stop if there are any mood/behavioral changes. - For eyes, use Olopatadine or Ketotifen 1 eye drop daily as needed for itchy, watery eyes.  Available over the counter, if not covered by insurance.  -  Consider allergy shots as long term control of your symptoms by teaching your immune system to be more tolerant of your allergy triggers   Concern For Stinging Insect Allergy- Anaphylaxis to Hymenoptera: - based on today's history, will obtain labs for stinging insects - if negative, would consider confirming with skin testing - please carry Epinephrine autoinjector at all times in case of accidental sting, to be used if stung and has SKIN AND ANY OTHER SYMPTOMS - for SKIN only, can take Benadryl 5040m - practice avoidance measures as outlined below when possible     Return in about 2 months (around 03/04/2022).  [facility]

## 2022-01-03 ENCOUNTER — Other Ambulatory Visit: Payer: Self-pay

## 2022-01-03 ENCOUNTER — Emergency Department (HOSPITAL_BASED_OUTPATIENT_CLINIC_OR_DEPARTMENT_OTHER)
Admission: EM | Admit: 2022-01-03 | Discharge: 2022-01-03 | Disposition: A | Discharge status: Home / Self Care | Payer: BC Managed Care – PPO | Attending: Emergency Medicine | Admitting: Emergency Medicine

## 2022-01-03 ENCOUNTER — Encounter (HOSPITAL_BASED_OUTPATIENT_CLINIC_OR_DEPARTMENT_OTHER): Payer: Self-pay | Admitting: Emergency Medicine

## 2022-01-03 DIAGNOSIS — T63441A Toxic effect of venom of bees, accidental (unintentional), initial encounter: Secondary | ICD-10-CM | POA: Diagnosis present

## 2022-01-03 MED ORDER — PREDNISONE 10 MG PO TABS: 40.0000 mg | ORAL_TABLET | Freq: Every day | ORAL | 0 refills | Status: AC

## 2022-01-03 MED ORDER — METHYLPREDNISOLONE SODIUM SUCC 125 MG IJ SOLR
125.0000 mg | Freq: Once | INTRAMUSCULAR | Status: AC
  Administered 2022-01-03: 125 mg via INTRAVENOUS
  Filled 2022-01-03: qty 2

## 2022-01-03 NOTE — ED Triage Notes (Signed)
20 ml of 12.5 mg benadryl taken

## 2022-01-03 NOTE — ED Provider Notes (Signed)
[facility] DEPT Provider Note   CSN: [id number] Arrival date & time: 01/03/22  1209     History  Chief Complaint  Patient presents with   Insect Bite   Allergic Reaction    [name] is a 36 y.o. female.  HPI 35 year old female history of severe allergic reaction to bee sting presents today after bee sting on her left hand.  She reports that she was supposed to pick up her EpiPen today.  She did not have it.  She took Benadryl and came to the ED.  She has had mild episode of feeling like she was short of breath and lightheaded but did not progress.  She currently has minimal redness or itching to the left hand.  She does not have a diffuse rash, difficulty breathing, vomiting, or diarrhea.     Home Medications Prior to Admission medications   Medication Sig Start Date End Date Taking? Authorizing Provider  predniSONE (DELTASONE) 10 MG tablet Take 4 tablets (40 mg total) by mouth daily. 01/03/22  Yes Pattricia Boss, MD  albuterol (VENTOLIN HFA) 108 (90 Base) MCG/ACT inhaler Inhale 2 puffs into the lungs every 6 (six) hours as needed for wheezing or shortness of breath. 01/01/22   Larose Kells, MD  azelastine (ASTELIN) 0.1 % nasal spray Place 1 spray into both nostrils 2 (two) times daily. Use in each nostril as directed 01/01/22   Larose Kells, MD  cetirizine (ZYRTEC) 10 MG tablet Take 1 tablet (10 mg total) by mouth daily. 01/01/22   Larose Kells, MD  EPINEPHrine 0.3 mg/0.3 mL IJ SOAJ injection Inject 0.3 mg into the muscle as needed for anaphylaxis. 01/01/22   Larose Kells, MD  escitalopram (LEXAPRO) 10 MG tablet Take 10 mg by mouth daily. 06/10/21   [provider]  famotidine (PEPCID) 20 MG tablet Take 20 mg by mouth 2 (two) times daily.    [provider]  fluticasone (FLONASE) 50 MCG/ACT nasal spray Place 2 sprays into both nostrils daily. 01/01/22   Larose Kells, MD  fluticasone (FLOVENT HFA) 110 MCG/ACT inhaler Inhale 2 puffs  into the lungs in the morning and at bedtime. 01/01/22   Larose Kells, MD  ipratropium (ATROVENT) 0.06 % nasal spray Place 2 sprays into both nostrils 4 (four) times daily. 12/01/21   [provider]  montelukast (SINGULAIR) 10 MG tablet Take 1 tablet (10 mg total) by mouth once for 1 dose. 01/01/22 01/01/22  Larose Kells, MD  Olopatadine HCl 0.2 % SOLN Apply 1 drop to eye daily as needed (itchy watery eyes). 01/01/22   Larose Kells, MD  sodium chloride (OCEAN) 0.65 % SOLN nasal spray Place 1 spray into both nostrils as needed for congestion.    [provider]  TIROSINT 75 MCG CAPS Take 75 mcg by mouth every morning. 02/23/20   [provider]  traZODone (DESYREL) 50 MG tablet Take 25-50 mg by mouth at bedtime. 09/06/21   [provider]  fluvoxaMINE (LUVOX) 100 MG tablet Take 100 mg by mouth 2 (two) times daily. 03/04/20 03/11/20  [provider]      Allergies    Avelox [moxifloxacin], Bee venom, Cephalosporins, Food, Penicillins, Sulfa antibiotics, Wheat bran, and Biaxin [clarithromycin]    Review of Systems   Review of Systems  Physical Exam Updated Vital Signs BP 102/66 (BP Location: Right Arm)   Pulse 65   Temp 98 F (36.7 C) (Oral)   Resp 18  SpO2 97%  Physical Exam Vitals reviewed.  Constitutional:      General: She is not in acute distress.    Appearance: Normal appearance. She is not ill-appearing.  HENT:     Head: Normocephalic.     Right Ear: External ear normal.     Left Ear: External ear normal.     Nose: Nose normal.     Mouth/Throat:     Mouth: Mucous membranes are moist.     Pharynx: Oropharynx is clear.  Eyes:     Pupils: Pupils are equal, round, and reactive to light.  Cardiovascular:     Rate and Rhythm: Normal rate and regular rhythm.     Pulses: Normal pulses.     Heart sounds: Normal heart sounds.  Pulmonary:     Effort: Pulmonary effort is normal.     Breath sounds: Normal breath sounds.  Abdominal:      General: Abdomen is flat.     Palpations: Abdomen is soft.  Musculoskeletal:     Cervical back: Normal range of motion.     Comments: Pinprick area  Skin:    General: Skin is warm and dry.     Capillary Refill: Capillary refill takes less than 2 seconds.  Neurological:     General: No focal deficit present.     Mental Status: She is alert.  Psychiatric:        Mood and Affect: Mood normal.     ED Results / Procedures / Treatments   Labs (all labs ordered are listed, but only abnormal results are displayed) Labs Reviewed - No data to display  EKG None  Radiology No results found.  Procedures Procedures    Medications Ordered in ED Medications  methylPREDNISolone sodium succinate (SOLU-MEDROL) 125 mg/2 mL injection 125 mg (125 mg Intravenous Given 01/03/22 1401)    ED Course/ Medical Decision Making/ A&P                           Medical Decision Making 36 year old female history of anaphylaxis to bee stings presents today with bee sting.  She did not have EpiPen.  She has not Benadryl.  Here in the ED she is given Solu-Medrol.  She was observed up to 3 hours past the episode.  She continues to remain hemodynamically stable with no signs of even local reaction. She will be started on prednisone outpatient for the next 5 days.  She is instructed to get her EpiPen filled and take it as needed.  She is instructed to return here if she has any symptoms of allergic reaction. She is hemodynamically stable and appears stable for discharge to home  Risk Prescription drug management.          Final Clinical Impression(s) / ED Diagnoses Final diagnoses:  Bee sting, accidental or unintentional, initial encounter    Rx / DC Orders ED Discharge Orders          Ordered    predniSONE (DELTASONE) 10 MG tablet  Daily        01/03/22 1506              Pattricia Boss, MD 01/03/22 1507

## 2022-01-03 NOTE — ED Triage Notes (Signed)
Stung by bee on left pinky, has hx of anaphylaxis.no epi pen. At pharmacy. Pt is nauseated, dizzy.

## 2022-01-03 NOTE — Discharge Instructions (Addendum)
Please pick up your EpiPen Take prednisone as prescribed Take Benadryl 25 mg every 6 hours Return immediately if you have any signs of worsening allergic reaction

## 2022-01-06 LAB — ALLERGEN HYMENOPTERA PANEL
Bumblebee: <0.1 kU/L
Honeybee IgE: <0.1 kU/L
Hornet, White Face, IgE: 2.59 kU/L — AB
Hornet, Yellow, IgE: 1.64 kU/L — AB
Paper Wasp IgE: 4.24 kU/L — AB
Yellow Jacket, IgE: 3.16 kU/L — AB

## 2022-03-19 ENCOUNTER — Ambulatory Visit: Payer: BC Managed Care – PPO | Admitting: Internal Medicine

## 2022-04-24 ENCOUNTER — Other Ambulatory Visit (HOSPITAL_BASED_OUTPATIENT_CLINIC_OR_DEPARTMENT_OTHER): Payer: Self-pay

## 2022-04-24 MED ORDER — CETIRIZINE HCL 10 MG PO TABS: 10.0000 mg | ORAL_TABLET | Freq: Every day | ORAL | 5 refills | Status: AC

## 2022-04-24 MED ORDER — AZELASTINE HCL 137 MCG/SPRAY NA SOLN: 1.0000 | Freq: Two times a day (BID) | NASAL | 2 refills | Status: AC | PRN

## 2022-04-24 MED ORDER — FLUTICASONE-SALMETEROL 115-21 MCG/ACT IN AERO: 2.0000 | INHALATION_SPRAY | Freq: Two times a day (BID) | RESPIRATORY_TRACT | 1 refills | Status: AC

## 2022-04-24 MED ORDER — ESCITALOPRAM OXALATE 10 MG PO TABS
15.0000 mg | ORAL_TABLET | Freq: Every day | ORAL | 1 refills | Status: DC
  Filled 2022-06-22 – 2022-06-30 (×2): qty 135, 90d supply, fill #0

## 2022-04-24 MED ORDER — OLOPATADINE HCL 0.1 % OP SOLN: 1.0000 [drp] | Freq: Every day | OPHTHALMIC | 2 refills | Status: AC | PRN

## 2022-04-24 MED ORDER — EPINEPHRINE 0.3 MG/0.3ML IJ SOAJ: 0.3000 mg | INTRAMUSCULAR | 1 refills | Status: DC | PRN

## 2022-04-24 MED ORDER — FLUTICASONE PROPIONATE 50 MCG/ACT NA SUSP: 2.0000 | Freq: Every day | NASAL | 2 refills | Status: AC

## 2022-04-24 MED ORDER — LEVOTHYROXINE SODIUM 75 MCG PO CAPS
75.0000 ug | ORAL_CAPSULE | Freq: Every morning | ORAL | 3 refills | Status: AC
  Filled 2022-04-24 – 2022-05-29 (×3): qty 30, 30d supply, fill #0
  Filled 2022-06-30: qty 30, 30d supply, fill #1
  Filled 2022-07-28: qty 30, 30d supply, fill #2

## 2022-04-24 MED ORDER — PULMICORT FLEXHALER 90 MCG/ACT IN AEPB: 1.0000 | INHALATION_SPRAY | Freq: Two times a day (BID) | RESPIRATORY_TRACT | 3 refills | Status: DC

## 2022-04-24 MED ORDER — MONTELUKAST SODIUM 10 MG PO TABS
10.0000 mg | ORAL_TABLET | Freq: Every day | ORAL | 1 refills | Status: DC
  Filled 2022-08-30: qty 90, 90d supply, fill #0

## 2022-04-24 MED ORDER — TRAZODONE HCL 100 MG PO TABS: 100.0000 mg | ORAL_TABLET | Freq: Every day | ORAL | 1 refills | Status: DC

## 2022-04-24 MED ORDER — ALBUTEROL SULFATE HFA 108 (90 BASE) MCG/ACT IN AERS: 2.0000 | INHALATION_SPRAY | Freq: Four times a day (QID) | RESPIRATORY_TRACT | 1 refills | Status: AC | PRN

## 2022-05-01 ENCOUNTER — Other Ambulatory Visit (HOSPITAL_BASED_OUTPATIENT_CLINIC_OR_DEPARTMENT_OTHER): Payer: Self-pay

## 2022-05-29 ENCOUNTER — Other Ambulatory Visit (HOSPITAL_BASED_OUTPATIENT_CLINIC_OR_DEPARTMENT_OTHER): Payer: Self-pay

## 2022-05-30 ENCOUNTER — Other Ambulatory Visit (HOSPITAL_BASED_OUTPATIENT_CLINIC_OR_DEPARTMENT_OTHER): Payer: Self-pay

## 2022-05-31 ENCOUNTER — Other Ambulatory Visit: Payer: Self-pay

## 2022-06-01 ENCOUNTER — Other Ambulatory Visit (HOSPITAL_BASED_OUTPATIENT_CLINIC_OR_DEPARTMENT_OTHER): Payer: Self-pay

## 2022-06-01 MED ORDER — MONTELUKAST SODIUM 10 MG PO TABS
10.0000 mg | ORAL_TABLET | Freq: Every day | ORAL | 1 refills | Status: DC
  Filled 2022-06-01: qty 90, 90d supply, fill #0
  Filled 2022-11-02: qty 90, 90d supply, fill #1

## 2022-06-02 ENCOUNTER — Other Ambulatory Visit: Payer: Self-pay | Admitting: Family Medicine

## 2022-06-02 DIAGNOSIS — N6012 Diffuse cystic mastopathy of left breast: Secondary | ICD-10-CM

## 2022-06-02 DIAGNOSIS — N644 Mastodynia: Secondary | ICD-10-CM

## 2022-06-02 DIAGNOSIS — N63 Unspecified lump in unspecified breast: Secondary | ICD-10-CM

## 2022-06-07 ENCOUNTER — Other Ambulatory Visit (HOSPITAL_BASED_OUTPATIENT_CLINIC_OR_DEPARTMENT_OTHER): Payer: Self-pay

## 2022-06-07 MED ORDER — ERGOCALCIFEROL 1.25 MG (50000 UT) PO CAPS
1.0000 | ORAL_CAPSULE | ORAL | 0 refills | Status: AC
  Filled 2022-06-07: qty 8, 56d supply, fill #0

## 2022-06-21 ENCOUNTER — Ambulatory Visit
Admission: RE | Admit: 2022-06-21 | Discharge: 2022-06-21 | Disposition: A | Discharge status: Home / Self Care | Payer: BC Managed Care – PPO | Source: Ambulatory Visit | Attending: Family Medicine | Admitting: Family Medicine

## 2022-06-21 ENCOUNTER — Other Ambulatory Visit: Payer: Self-pay | Admitting: Family Medicine

## 2022-06-21 DIAGNOSIS — N644 Mastodynia: Secondary | ICD-10-CM

## 2022-06-21 DIAGNOSIS — R928 Other abnormal and inconclusive findings on diagnostic imaging of breast: Secondary | ICD-10-CM

## 2022-06-21 DIAGNOSIS — N6012 Diffuse cystic mastopathy of left breast: Secondary | ICD-10-CM

## 2022-06-21 DIAGNOSIS — N63 Unspecified lump in unspecified breast: Secondary | ICD-10-CM

## 2022-06-22 ENCOUNTER — Other Ambulatory Visit: Payer: Self-pay

## 2022-06-22 ENCOUNTER — Other Ambulatory Visit (HOSPITAL_BASED_OUTPATIENT_CLINIC_OR_DEPARTMENT_OTHER): Payer: Self-pay

## 2022-06-24 ENCOUNTER — Other Ambulatory Visit: Payer: Self-pay | Admitting: Family Medicine

## 2022-06-24 DIAGNOSIS — N6310 Unspecified lump in the right breast, unspecified quadrant: Secondary | ICD-10-CM

## 2022-06-30 ENCOUNTER — Other Ambulatory Visit (HOSPITAL_BASED_OUTPATIENT_CLINIC_OR_DEPARTMENT_OTHER): Payer: Self-pay

## 2022-07-01 ENCOUNTER — Other Ambulatory Visit (HOSPITAL_BASED_OUTPATIENT_CLINIC_OR_DEPARTMENT_OTHER): Payer: Self-pay

## 2022-07-02 ENCOUNTER — Other Ambulatory Visit (HOSPITAL_BASED_OUTPATIENT_CLINIC_OR_DEPARTMENT_OTHER): Payer: Self-pay

## 2022-07-13 IMAGING — US US ABDOMEN LIMITED
1 series · 14 of 25 positions shown · non-contrast
Comparison: Ultrasound 11/19/2010, CT abdomen pelvis 10/21/2017

CLINICAL DATA: Intermittent right upper quadrant abdominal pain for
10 years

EXAM:
ULTRASOUND ABDOMEN LIMITED RIGHT UPPER QUADRANT

[Series 1: us abdomen limited · 0.16mm/px · 14 of 44 slices shown]
[im 1/44]
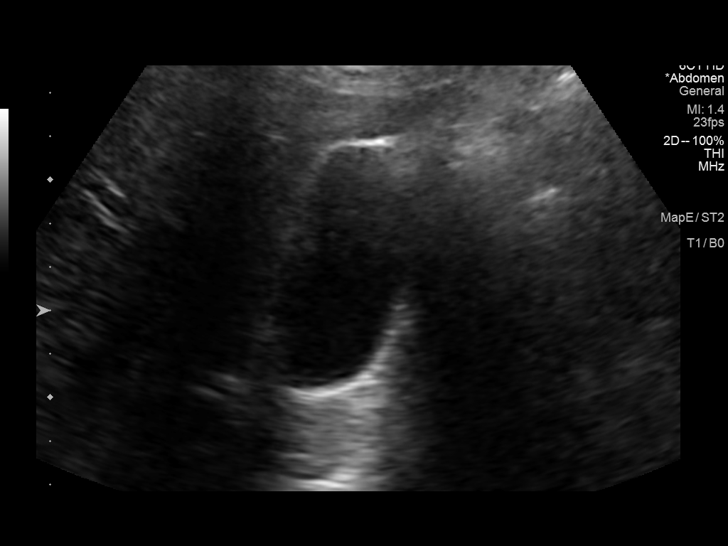
[im 4/44]
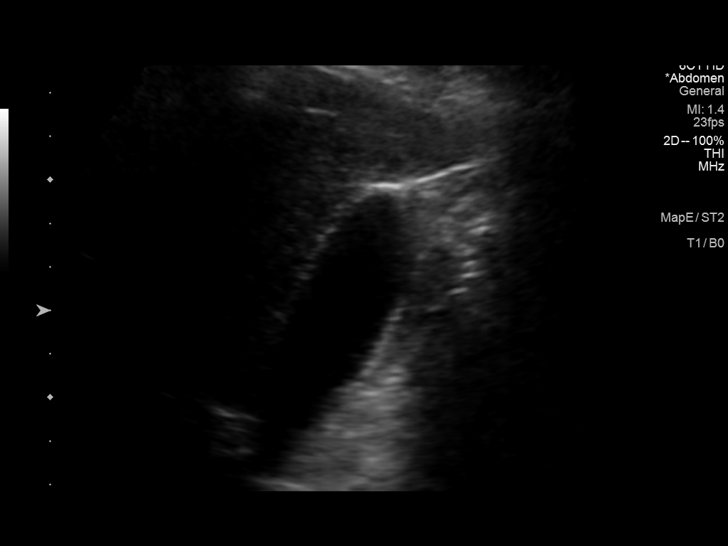
[im 8/44]
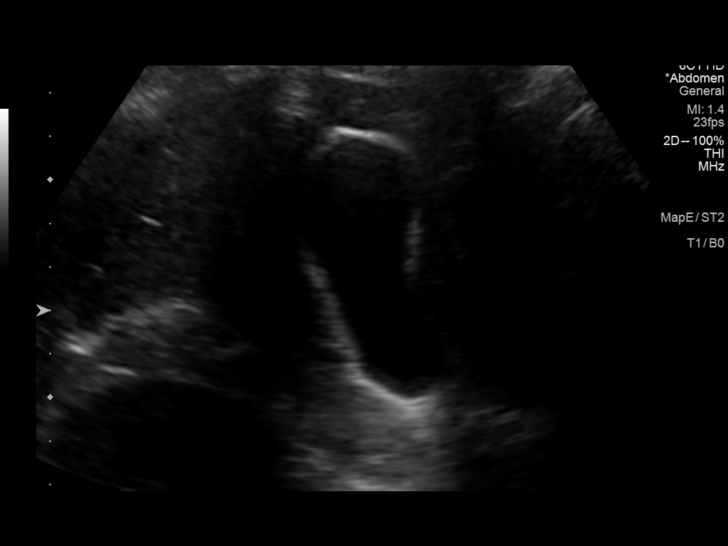
[im 11/44]
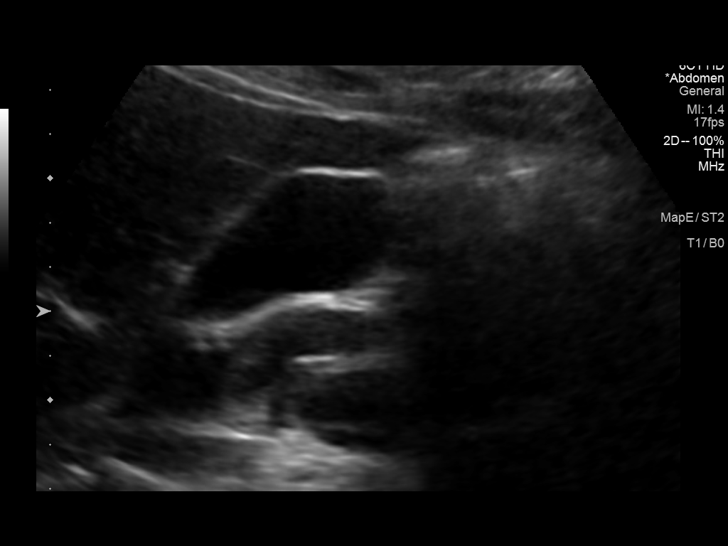
[im 15/44]
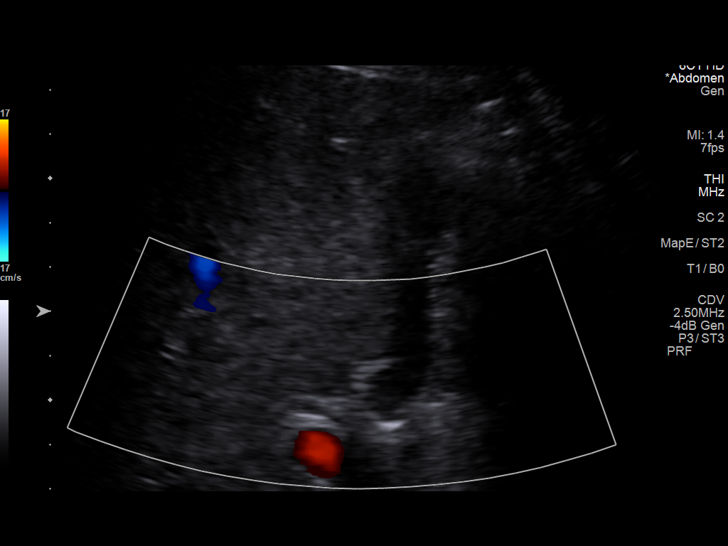
[im 17/44]
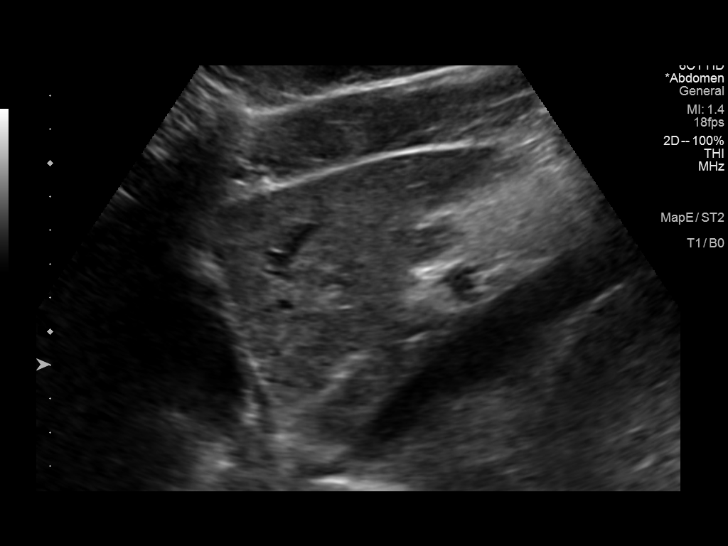
[im 20/44]
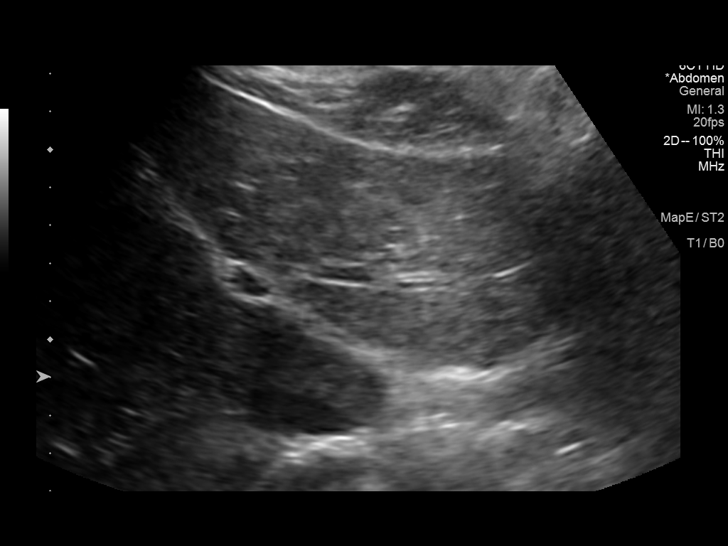
[im 24/44]
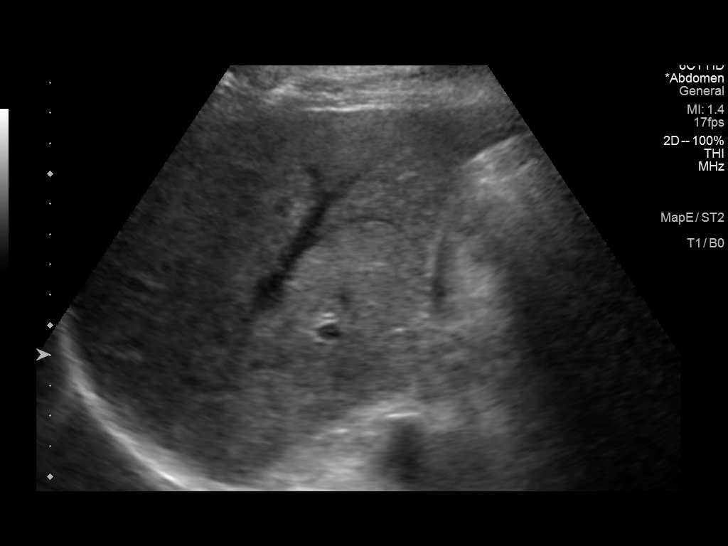
[im 27/44]
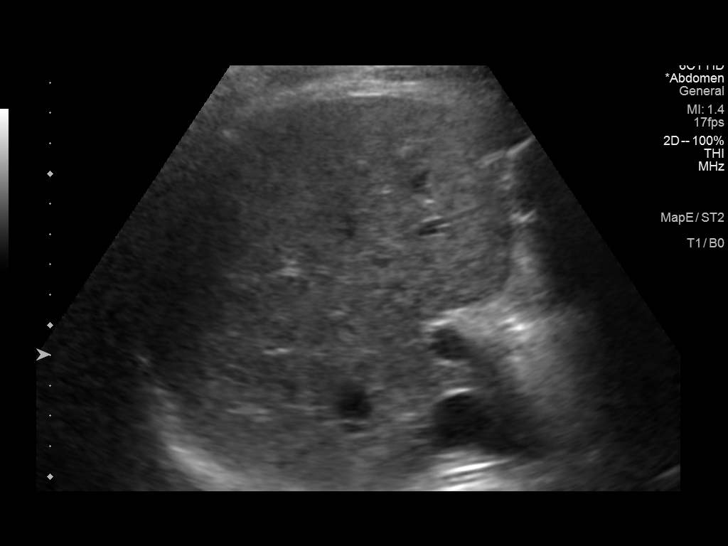
[im 29/44]
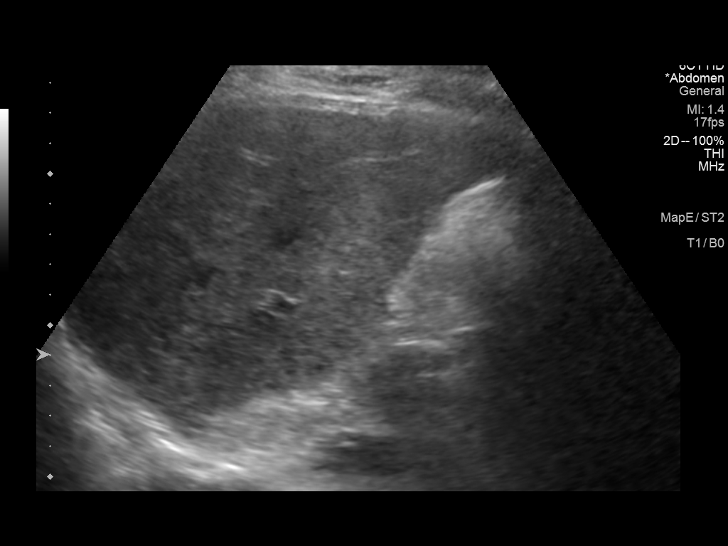
[im 33/44]
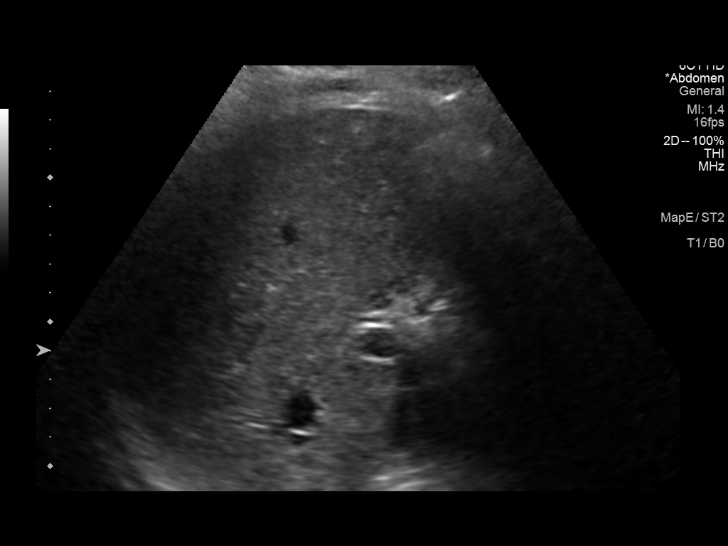
[im 36/44]
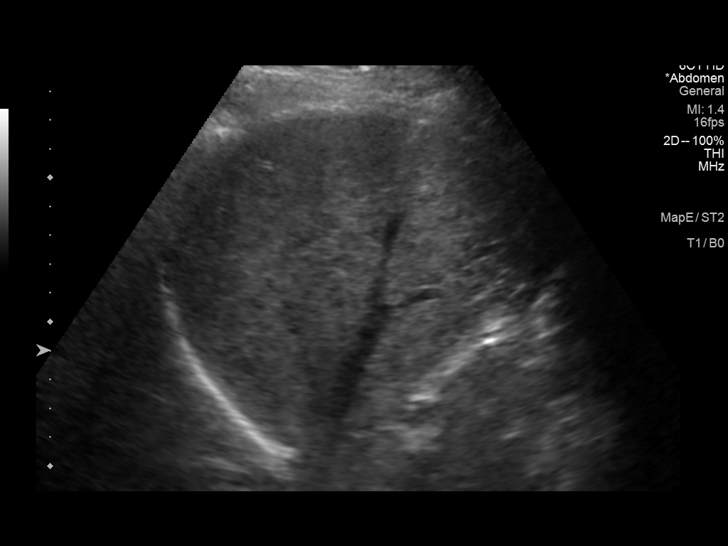
[im 40/44]
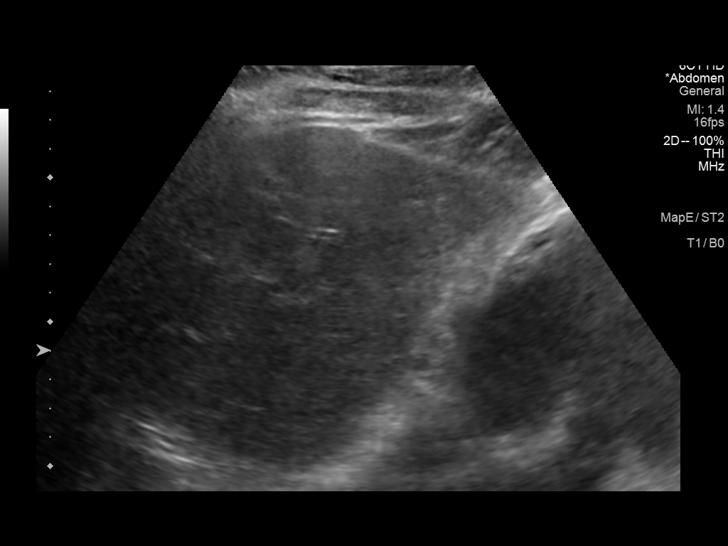
[im 44/44]
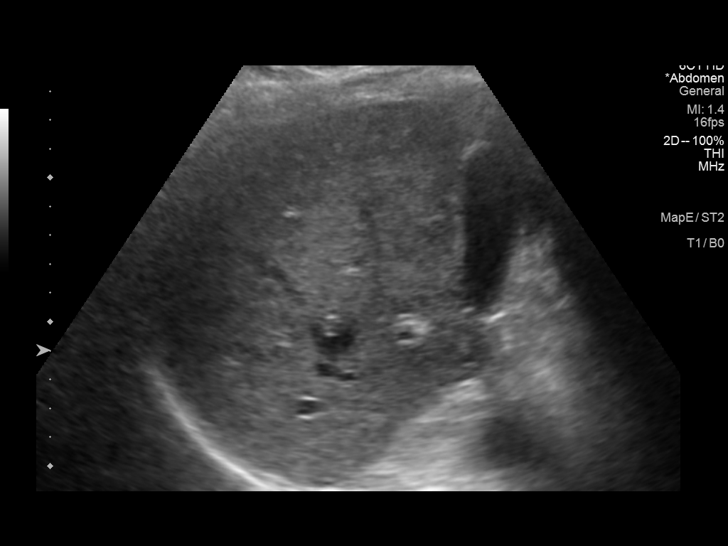

[14 of 25 positions shown; findings below may reference images not displayed]

FINDINGS: Gallbladder:

No gallstones or wall thickening visualized. No sonographic Murphy
sign noted by sonographer.

Common bile duct:

Diameter: 3.5 mm, nondilated

Liver:

No focal lesion identified. Within normal limits in parenchymal
echogenicity. No intrahepatic biliary dilatation. Portal vein is
patent on color Doppler imaging with normal direction of blood flow
towards the liver.

Other: None.
IMPRESSION: Unremarkable right upper quadrant ultrasound.

## 2022-07-17 ENCOUNTER — Other Ambulatory Visit (HOSPITAL_BASED_OUTPATIENT_CLINIC_OR_DEPARTMENT_OTHER): Payer: Self-pay

## 2022-07-19 ENCOUNTER — Other Ambulatory Visit (HOSPITAL_BASED_OUTPATIENT_CLINIC_OR_DEPARTMENT_OTHER): Payer: Self-pay

## 2022-07-19 MED ORDER — TRAZODONE HCL 100 MG PO TABS
100.0000 mg | ORAL_TABLET | Freq: Every day | ORAL | 0 refills | Status: AC
  Filled 2022-07-19: qty 90, 90d supply, fill #0

## 2022-07-28 ENCOUNTER — Other Ambulatory Visit (HOSPITAL_BASED_OUTPATIENT_CLINIC_OR_DEPARTMENT_OTHER): Payer: Self-pay

## 2022-07-29 ENCOUNTER — Other Ambulatory Visit: Payer: Self-pay

## 2022-07-30 ENCOUNTER — Other Ambulatory Visit: Payer: Self-pay

## 2022-07-30 ENCOUNTER — Other Ambulatory Visit (HOSPITAL_BASED_OUTPATIENT_CLINIC_OR_DEPARTMENT_OTHER): Payer: Self-pay

## 2022-08-02 ENCOUNTER — Other Ambulatory Visit (HOSPITAL_BASED_OUTPATIENT_CLINIC_OR_DEPARTMENT_OTHER): Payer: Self-pay

## 2022-08-10 ENCOUNTER — Other Ambulatory Visit (HOSPITAL_BASED_OUTPATIENT_CLINIC_OR_DEPARTMENT_OTHER): Payer: Self-pay

## 2022-08-10 ENCOUNTER — Other Ambulatory Visit: Payer: Self-pay

## 2022-08-10 MED ORDER — ALPRAZOLAM 0.5 MG PO TABS
0.5000 mg | ORAL_TABLET | Freq: Every day | ORAL | 2 refills | Status: AC | PRN
  Filled 2022-08-10: qty 30, 30d supply, fill #0
  Filled 2022-12-13: qty 30, 30d supply, fill #1
  Filled 2023-01-31: qty 30, 30d supply, fill #2

## 2022-08-10 MED ORDER — LEVOTHYROXINE SODIUM 88 MCG PO CAPS
88.0000 ug | ORAL_CAPSULE | Freq: Every morning | ORAL | 1 refills | Status: DC
  Filled 2022-08-10 – 2022-08-30 (×2): qty 90, 90d supply, fill #0
  Filled 2022-12-04: qty 90, 90d supply, fill #1

## 2022-08-11 ENCOUNTER — Other Ambulatory Visit: Payer: Self-pay

## 2022-08-11 ENCOUNTER — Other Ambulatory Visit (HOSPITAL_BASED_OUTPATIENT_CLINIC_OR_DEPARTMENT_OTHER): Payer: Self-pay

## 2022-08-14 ENCOUNTER — Other Ambulatory Visit (HOSPITAL_BASED_OUTPATIENT_CLINIC_OR_DEPARTMENT_OTHER): Payer: Self-pay

## 2022-08-30 ENCOUNTER — Other Ambulatory Visit (HOSPITAL_BASED_OUTPATIENT_CLINIC_OR_DEPARTMENT_OTHER): Payer: Self-pay

## 2022-08-30 ENCOUNTER — Other Ambulatory Visit: Payer: Self-pay

## 2022-08-30 MED ORDER — BENZONATATE 200 MG PO CAPS
200.0000 mg | ORAL_CAPSULE | Freq: Three times a day (TID) | ORAL | 0 refills | Status: AC
  Filled 2022-08-30: qty 45, 15d supply, fill #0

## 2022-08-30 MED ORDER — HYDROCOD POLI-CHLORPHE POLI ER 10-8 MG/5ML PO SUER
ORAL | 0 refills | Status: DC
  Filled 2022-08-30: qty 150, 15d supply, fill #0

## 2022-08-30 MED ORDER — PAXLOVID (300/100) 20 X 150 MG & 10 X 100MG PO TBPK
ORAL_TABLET | ORAL | 0 refills | Status: DC
  Filled 2022-08-30 (×2): qty 30, 5d supply, fill #0

## 2022-08-31 ENCOUNTER — Other Ambulatory Visit: Payer: Self-pay

## 2022-09-06 ENCOUNTER — Other Ambulatory Visit (HOSPITAL_BASED_OUTPATIENT_CLINIC_OR_DEPARTMENT_OTHER): Payer: Self-pay

## 2022-11-02 ENCOUNTER — Other Ambulatory Visit (HOSPITAL_COMMUNITY): Payer: Self-pay | Admitting: Gastroenterology

## 2022-11-02 ENCOUNTER — Other Ambulatory Visit (HOSPITAL_BASED_OUTPATIENT_CLINIC_OR_DEPARTMENT_OTHER): Payer: Self-pay

## 2022-11-02 DIAGNOSIS — R112 Nausea with vomiting, unspecified: Secondary | ICD-10-CM

## 2022-11-03 ENCOUNTER — Other Ambulatory Visit: Payer: Self-pay

## 2022-11-03 ENCOUNTER — Other Ambulatory Visit (HOSPITAL_BASED_OUTPATIENT_CLINIC_OR_DEPARTMENT_OTHER): Payer: Self-pay

## 2022-11-03 MED ORDER — ESCITALOPRAM OXALATE 10 MG PO TABS
15.0000 mg | ORAL_TABLET | Freq: Every day | ORAL | 1 refills | Status: DC
  Filled 2022-11-03: qty 135, 90d supply, fill #0
  Filled 2023-03-16: qty 135, 90d supply, fill #1

## 2022-11-04 ENCOUNTER — Other Ambulatory Visit (HOSPITAL_BASED_OUTPATIENT_CLINIC_OR_DEPARTMENT_OTHER): Payer: Self-pay

## 2022-11-04 MED ORDER — CLINDAMYCIN HCL 300 MG PO CAPS
300.0000 mg | ORAL_CAPSULE | Freq: Three times a day (TID) | ORAL | 0 refills | Status: DC
  Filled 2022-11-04: qty 30, 10d supply, fill #0

## 2022-11-12 ENCOUNTER — Encounter (HOSPITAL_COMMUNITY)
Admission: RE | Admit: 2022-11-12 | Discharge: 2022-11-12 | Disposition: A | Discharge status: Home / Self Care | Payer: BC Managed Care – PPO | Source: Ambulatory Visit | Attending: Gastroenterology | Admitting: Gastroenterology

## 2022-11-12 DIAGNOSIS — R112 Nausea with vomiting, unspecified: Secondary | ICD-10-CM | POA: Insufficient documentation

## 2022-11-12 MED ORDER — TECHNETIUM TC 99M MEBROFENIN IV KIT
5.0000 | PACK | Freq: Once | INTRAVENOUS | Status: AC | PRN
  Administered 2022-11-12: 5 via INTRAVENOUS

## 2022-11-25 ENCOUNTER — Other Ambulatory Visit: Payer: Self-pay | Admitting: Medical Genetics

## 2022-11-25 DIAGNOSIS — Z006 Encounter for examination for normal comparison and control in clinical research program: Secondary | ICD-10-CM

## 2022-11-29 ENCOUNTER — Other Ambulatory Visit (HOSPITAL_BASED_OUTPATIENT_CLINIC_OR_DEPARTMENT_OTHER): Payer: Self-pay

## 2022-11-29 MED ORDER — METHOCARBAMOL 500 MG PO TABS
500.0000 mg | ORAL_TABLET | Freq: Three times a day (TID) | ORAL | 1 refills | Status: DC
  Filled 2022-11-29: qty 30, 10d supply, fill #0
  Filled 2022-12-18: qty 30, 10d supply, fill #1

## 2022-11-29 MED ORDER — METHYLPREDNISOLONE 4 MG PO TBPK
ORAL_TABLET | ORAL | 0 refills | Status: DC
  Filled 2022-11-29: qty 21, 6d supply, fill #0

## 2022-12-02 ENCOUNTER — Ambulatory Visit: Payer: Self-pay

## 2022-12-02 ENCOUNTER — Other Ambulatory Visit (HOSPITAL_COMMUNITY): Payer: BC Managed Care – PPO

## 2022-12-02 ENCOUNTER — Other Ambulatory Visit (HOSPITAL_BASED_OUTPATIENT_CLINIC_OR_DEPARTMENT_OTHER): Payer: Self-pay

## 2022-12-02 ENCOUNTER — Other Ambulatory Visit: Payer: Self-pay | Admitting: Nurse Practitioner

## 2022-12-02 DIAGNOSIS — M25572 Pain in left ankle and joints of left foot: Secondary | ICD-10-CM

## 2022-12-02 DIAGNOSIS — M79672 Pain in left foot: Secondary | ICD-10-CM

## 2022-12-02 MED ORDER — OXYCODONE-ACETAMINOPHEN 5-325 MG PO TABS
1.0000 | ORAL_TABLET | ORAL | 0 refills | Status: DC | PRN
  Filled 2022-12-02: qty 30, 5d supply, fill #0

## 2022-12-06 ENCOUNTER — Other Ambulatory Visit (HOSPITAL_BASED_OUTPATIENT_CLINIC_OR_DEPARTMENT_OTHER): Payer: Self-pay

## 2022-12-06 ENCOUNTER — Other Ambulatory Visit: Payer: Self-pay

## 2022-12-07 ENCOUNTER — Other Ambulatory Visit (HOSPITAL_COMMUNITY)
Admission: RE | Admit: 2022-12-07 | Discharge: 2022-12-07 | Disposition: A | Discharge status: Home / Self Care | Payer: BC Managed Care – PPO | Source: Ambulatory Visit | Attending: Medical Genetics | Admitting: Medical Genetics

## 2022-12-07 ENCOUNTER — Other Ambulatory Visit (HOSPITAL_BASED_OUTPATIENT_CLINIC_OR_DEPARTMENT_OTHER): Payer: Self-pay

## 2022-12-07 DIAGNOSIS — Z006 Encounter for examination for normal comparison and control in clinical research program: Secondary | ICD-10-CM | POA: Insufficient documentation

## 2022-12-13 ENCOUNTER — Other Ambulatory Visit (HOSPITAL_BASED_OUTPATIENT_CLINIC_OR_DEPARTMENT_OTHER): Payer: Self-pay

## 2022-12-13 MED ORDER — TRAMADOL HCL 50 MG PO TABS
50.0000 mg | ORAL_TABLET | Freq: Four times a day (QID) | ORAL | 0 refills | Status: DC
Start: 2022-12-13 — End: 2023-09-17
  Filled 2022-12-13: qty 20, 7d supply, fill #0

## 2022-12-14 ENCOUNTER — Other Ambulatory Visit (HOSPITAL_BASED_OUTPATIENT_CLINIC_OR_DEPARTMENT_OTHER): Payer: Self-pay

## 2022-12-15 ENCOUNTER — Other Ambulatory Visit: Payer: Self-pay

## 2022-12-17 LAB — HELIX MOLECULAR SCREEN: Genetic Analysis Overall Interpretation: NEGATIVE

## 2022-12-17 LAB — GENECONNECT MOLECULAR SCREEN

## 2022-12-27 ENCOUNTER — Other Ambulatory Visit: Payer: Self-pay | Admitting: Family Medicine

## 2022-12-27 ENCOUNTER — Other Ambulatory Visit (HOSPITAL_BASED_OUTPATIENT_CLINIC_OR_DEPARTMENT_OTHER): Payer: Self-pay

## 2022-12-27 DIAGNOSIS — N6310 Unspecified lump in the right breast, unspecified quadrant: Secondary | ICD-10-CM

## 2022-12-27 MED ORDER — DOXYCYCLINE HYCLATE 100 MG PO CAPS
100.0000 mg | ORAL_CAPSULE | Freq: Two times a day (BID) | ORAL | 0 refills | Status: AC
  Filled 2022-12-27: qty 14, 7d supply, fill #0

## 2022-12-29 ENCOUNTER — Ambulatory Visit
Admission: RE | Admit: 2022-12-29 | Discharge: 2022-12-29 | Disposition: A | Discharge status: Home / Self Care | Payer: BC Managed Care – PPO | Source: Ambulatory Visit | Attending: Family Medicine | Admitting: Family Medicine

## 2022-12-29 ENCOUNTER — Other Ambulatory Visit: Payer: Self-pay | Admitting: Family Medicine

## 2022-12-29 DIAGNOSIS — N6310 Unspecified lump in the right breast, unspecified quadrant: Secondary | ICD-10-CM

## 2023-01-06 ENCOUNTER — Other Ambulatory Visit (HOSPITAL_BASED_OUTPATIENT_CLINIC_OR_DEPARTMENT_OTHER): Payer: Self-pay

## 2023-01-07 ENCOUNTER — Other Ambulatory Visit (HOSPITAL_BASED_OUTPATIENT_CLINIC_OR_DEPARTMENT_OTHER): Payer: Self-pay

## 2023-01-07 MED ORDER — TRAZODONE HCL 100 MG PO TABS
50.0000 mg | ORAL_TABLET | Freq: Every day | ORAL | 0 refills | Status: DC
  Filled 2023-01-07: qty 45, 90d supply, fill #0

## 2023-01-20 ENCOUNTER — Other Ambulatory Visit (HOSPITAL_BASED_OUTPATIENT_CLINIC_OR_DEPARTMENT_OTHER): Payer: Self-pay

## 2023-01-20 MED ORDER — DOXYCYCLINE MONOHYDRATE 100 MG PO CAPS
100.0000 mg | ORAL_CAPSULE | Freq: Two times a day (BID) | ORAL | 0 refills | Status: DC
  Filled 2023-01-20: qty 10, 5d supply, fill #0

## 2023-01-31 ENCOUNTER — Other Ambulatory Visit: Payer: Self-pay

## 2023-02-03 ENCOUNTER — Other Ambulatory Visit (HOSPITAL_BASED_OUTPATIENT_CLINIC_OR_DEPARTMENT_OTHER): Payer: Self-pay

## 2023-02-04 ENCOUNTER — Other Ambulatory Visit (HOSPITAL_BASED_OUTPATIENT_CLINIC_OR_DEPARTMENT_OTHER): Payer: Self-pay

## 2023-02-04 MED ORDER — DICYCLOMINE HCL 10 MG PO CAPS
20.0000 mg | ORAL_CAPSULE | Freq: Three times a day (TID) | ORAL | 0 refills | Status: AC
  Filled 2023-02-04: qty 60, 10d supply, fill #0

## 2023-02-10 ENCOUNTER — Other Ambulatory Visit (HOSPITAL_BASED_OUTPATIENT_CLINIC_OR_DEPARTMENT_OTHER): Payer: Self-pay

## 2023-02-10 MED ORDER — HYDROCODONE BIT-HOMATROP MBR 5-1.5 MG/5ML PO SOLN
5.0000 mL | Freq: Every evening | ORAL | 0 refills | Status: AC | PRN
  Filled 2023-02-10: qty 35, 7d supply, fill #0

## 2023-02-10 MED ORDER — PREDNISONE 20 MG PO TABS
40.0000 mg | ORAL_TABLET | Freq: Every day | ORAL | 0 refills | Status: AC
  Filled 2023-02-10 – 2023-02-14 (×3): qty 10, 5d supply, fill #0

## 2023-02-11 ENCOUNTER — Other Ambulatory Visit (HOSPITAL_BASED_OUTPATIENT_CLINIC_OR_DEPARTMENT_OTHER): Payer: Self-pay

## 2023-02-14 ENCOUNTER — Other Ambulatory Visit (HOSPITAL_BASED_OUTPATIENT_CLINIC_OR_DEPARTMENT_OTHER): Payer: Self-pay

## 2023-02-14 ENCOUNTER — Other Ambulatory Visit: Payer: Self-pay

## 2023-03-09 ENCOUNTER — Other Ambulatory Visit (HOSPITAL_BASED_OUTPATIENT_CLINIC_OR_DEPARTMENT_OTHER): Payer: Self-pay

## 2023-03-09 ENCOUNTER — Other Ambulatory Visit: Payer: Self-pay

## 2023-03-09 MED ORDER — LEVOTHYROXINE SODIUM 88 MCG PO CAPS
88.0000 ug | ORAL_CAPSULE | Freq: Every morning | ORAL | 3 refills | Status: AC
  Filled 2023-03-09: qty 90, 90d supply, fill #0

## 2023-03-09 MED ORDER — LEVOTHYROXINE SODIUM 88 MCG PO CAPS
88.0000 ug | ORAL_CAPSULE | Freq: Every morning | ORAL | 3 refills | Status: AC
  Filled 2023-03-09: qty 90, 90d supply, fill #0
  Filled 2023-03-14: qty 30, 30d supply, fill #0
  Filled 2023-04-09: qty 30, 30d supply, fill #1
  Filled 2023-05-06: qty 30, 30d supply, fill #2
  Filled 2023-06-08: qty 30, 30d supply, fill #3
  Filled 2023-07-07: qty 30, 30d supply, fill #4
  Filled 2023-08-12: qty 90, 90d supply, fill #5
  Filled 2023-11-17 – 2023-11-20 (×2): qty 90, 90d supply, fill #6
  Filled 2024-02-12: qty 30, 30d supply, fill #7

## 2023-03-09 MED ORDER — OSELTAMIVIR PHOSPHATE 75 MG PO CAPS
75.0000 mg | ORAL_CAPSULE | Freq: Two times a day (BID) | ORAL | 0 refills | Status: AC
  Filled 2023-03-09: qty 10, 5d supply, fill #0

## 2023-03-14 ENCOUNTER — Other Ambulatory Visit (HOSPITAL_BASED_OUTPATIENT_CLINIC_OR_DEPARTMENT_OTHER): Payer: Self-pay

## 2023-03-16 ENCOUNTER — Other Ambulatory Visit (HOSPITAL_BASED_OUTPATIENT_CLINIC_OR_DEPARTMENT_OTHER): Payer: Self-pay

## 2023-03-16 MED ORDER — AZITHROMYCIN 250 MG PO TABS
ORAL_TABLET | ORAL | 0 refills | Status: AC
  Filled 2023-03-16: qty 6, 5d supply, fill #0

## 2023-03-22 ENCOUNTER — Other Ambulatory Visit (HOSPITAL_BASED_OUTPATIENT_CLINIC_OR_DEPARTMENT_OTHER): Payer: Self-pay

## 2023-03-22 MED ORDER — DOXYCYCLINE HYCLATE 100 MG PO CAPS
100.0000 mg | ORAL_CAPSULE | Freq: Two times a day (BID) | ORAL | 0 refills | Status: DC
  Filled 2023-03-22: qty 14, 7d supply, fill #0

## 2023-03-23 ENCOUNTER — Other Ambulatory Visit (HOSPITAL_BASED_OUTPATIENT_CLINIC_OR_DEPARTMENT_OTHER): Payer: Self-pay

## 2023-03-23 MED ORDER — MONTELUKAST SODIUM 10 MG PO TABS
10.0000 mg | ORAL_TABLET | Freq: Every day | ORAL | 1 refills | Status: AC
  Filled 2023-03-23: qty 90, 90d supply, fill #0
  Filled 2024-01-16: qty 90, 90d supply, fill #1

## 2023-03-27 ENCOUNTER — Other Ambulatory Visit (HOSPITAL_BASED_OUTPATIENT_CLINIC_OR_DEPARTMENT_OTHER): Payer: Self-pay

## 2023-03-28 ENCOUNTER — Other Ambulatory Visit (HOSPITAL_BASED_OUTPATIENT_CLINIC_OR_DEPARTMENT_OTHER): Payer: Self-pay

## 2023-03-28 MED ORDER — TRAZODONE HCL 100 MG PO TABS
100.0000 mg | ORAL_TABLET | Freq: Every day | ORAL | 3 refills | Status: AC
  Filled 2023-03-29: qty 90, 90d supply, fill #0
  Filled 2023-08-01: qty 90, 90d supply, fill #1
  Filled 2023-12-21: qty 90, 90d supply, fill #2

## 2023-03-29 ENCOUNTER — Other Ambulatory Visit (HOSPITAL_BASED_OUTPATIENT_CLINIC_OR_DEPARTMENT_OTHER): Payer: Self-pay

## 2023-04-09 ENCOUNTER — Other Ambulatory Visit (HOSPITAL_BASED_OUTPATIENT_CLINIC_OR_DEPARTMENT_OTHER): Payer: Self-pay

## 2023-04-11 ENCOUNTER — Other Ambulatory Visit (HOSPITAL_BASED_OUTPATIENT_CLINIC_OR_DEPARTMENT_OTHER): Payer: Self-pay

## 2023-04-11 ENCOUNTER — Other Ambulatory Visit: Payer: Self-pay

## 2023-04-12 ENCOUNTER — Other Ambulatory Visit (HOSPITAL_BASED_OUTPATIENT_CLINIC_OR_DEPARTMENT_OTHER): Payer: Self-pay

## 2023-04-13 ENCOUNTER — Other Ambulatory Visit (HOSPITAL_BASED_OUTPATIENT_CLINIC_OR_DEPARTMENT_OTHER): Payer: Self-pay

## 2023-05-07 ENCOUNTER — Other Ambulatory Visit (HOSPITAL_BASED_OUTPATIENT_CLINIC_OR_DEPARTMENT_OTHER): Payer: Self-pay

## 2023-05-09 ENCOUNTER — Other Ambulatory Visit (HOSPITAL_BASED_OUTPATIENT_CLINIC_OR_DEPARTMENT_OTHER): Payer: Self-pay

## 2023-06-02 ENCOUNTER — Other Ambulatory Visit (HOSPITAL_BASED_OUTPATIENT_CLINIC_OR_DEPARTMENT_OTHER): Payer: Self-pay

## 2023-06-02 MED ORDER — METHYLPREDNISOLONE 4 MG PO TBPK
ORAL_TABLET | ORAL | 1 refills | Status: AC
  Filled 2023-06-02: qty 21, 6d supply, fill #0
  Filled 2023-12-05: qty 21, 6d supply, fill #1

## 2023-06-02 MED ORDER — AZITHROMYCIN 250 MG PO TABS
ORAL_TABLET | ORAL | 1 refills | Status: DC
  Filled 2023-06-02: qty 6, 5d supply, fill #0
  Filled 2023-06-25 (×2): qty 6, 5d supply, fill #1

## 2023-06-08 ENCOUNTER — Other Ambulatory Visit (HOSPITAL_BASED_OUTPATIENT_CLINIC_OR_DEPARTMENT_OTHER): Payer: Self-pay

## 2023-06-09 ENCOUNTER — Other Ambulatory Visit (HOSPITAL_BASED_OUTPATIENT_CLINIC_OR_DEPARTMENT_OTHER): Payer: Self-pay

## 2023-06-10 ENCOUNTER — Other Ambulatory Visit (HOSPITAL_BASED_OUTPATIENT_CLINIC_OR_DEPARTMENT_OTHER): Payer: Self-pay

## 2023-06-20 ENCOUNTER — Ambulatory Visit
Admission: RE | Admit: 2023-06-20 | Discharge: 2023-06-20 | Disposition: A | Discharge status: Home / Self Care | Payer: BC Managed Care – PPO | Source: Ambulatory Visit | Attending: Family Medicine | Admitting: Family Medicine

## 2023-06-20 DIAGNOSIS — N6310 Unspecified lump in the right breast, unspecified quadrant: Secondary | ICD-10-CM

## 2023-06-25 ENCOUNTER — Other Ambulatory Visit (HOSPITAL_BASED_OUTPATIENT_CLINIC_OR_DEPARTMENT_OTHER): Payer: Self-pay

## 2023-07-07 ENCOUNTER — Other Ambulatory Visit: Payer: Self-pay | Admitting: Internal Medicine

## 2023-07-07 ENCOUNTER — Other Ambulatory Visit (HOSPITAL_BASED_OUTPATIENT_CLINIC_OR_DEPARTMENT_OTHER): Payer: Self-pay

## 2023-07-07 MED ORDER — MONTELUKAST SODIUM 10 MG PO TABS
10.0000 mg | ORAL_TABLET | Freq: Every day | ORAL | 1 refills | Status: AC
  Filled 2023-07-07: qty 90, 90d supply, fill #0
  Filled 2023-10-13: qty 90, 90d supply, fill #1

## 2023-07-08 ENCOUNTER — Other Ambulatory Visit (HOSPITAL_BASED_OUTPATIENT_CLINIC_OR_DEPARTMENT_OTHER): Payer: Self-pay

## 2023-07-08 MED ORDER — EPINEPHRINE 0.3 MG/0.3ML IJ SOAJ
0.3000 mg | INTRAMUSCULAR | 1 refills | Status: AC | PRN
  Filled 2023-07-08: qty 2, 1d supply, fill #0

## 2023-07-09 ENCOUNTER — Other Ambulatory Visit (HOSPITAL_BASED_OUTPATIENT_CLINIC_OR_DEPARTMENT_OTHER): Payer: Self-pay

## 2023-07-11 ENCOUNTER — Other Ambulatory Visit (HOSPITAL_BASED_OUTPATIENT_CLINIC_OR_DEPARTMENT_OTHER): Payer: Self-pay

## 2023-07-16 ENCOUNTER — Other Ambulatory Visit (HOSPITAL_BASED_OUTPATIENT_CLINIC_OR_DEPARTMENT_OTHER): Payer: Self-pay

## 2023-08-01 ENCOUNTER — Other Ambulatory Visit (HOSPITAL_BASED_OUTPATIENT_CLINIC_OR_DEPARTMENT_OTHER): Payer: Self-pay

## 2023-08-01 ENCOUNTER — Other Ambulatory Visit: Payer: Self-pay

## 2023-08-01 MED ORDER — ESCITALOPRAM OXALATE 10 MG PO TABS
15.0000 mg | ORAL_TABLET | Freq: Every day | ORAL | 1 refills | Status: AC
  Filled 2023-08-01: qty 135, 90d supply, fill #0
  Filled 2023-12-15: qty 135, 90d supply, fill #1

## 2023-08-12 ENCOUNTER — Other Ambulatory Visit (HOSPITAL_BASED_OUTPATIENT_CLINIC_OR_DEPARTMENT_OTHER): Payer: Self-pay

## 2023-08-12 MED ORDER — CLOBETASOL PROPIONATE 0.05 % EX CREA
TOPICAL_CREAM | CUTANEOUS | 0 refills | Status: DC
  Filled 2023-08-12: qty 15, 30d supply, fill #0

## 2023-08-15 ENCOUNTER — Other Ambulatory Visit (HOSPITAL_BASED_OUTPATIENT_CLINIC_OR_DEPARTMENT_OTHER): Payer: Self-pay

## 2023-08-18 ENCOUNTER — Other Ambulatory Visit (HOSPITAL_BASED_OUTPATIENT_CLINIC_OR_DEPARTMENT_OTHER): Payer: Self-pay

## 2023-08-18 ENCOUNTER — Other Ambulatory Visit: Payer: Self-pay

## 2023-08-18 MED ORDER — PULMICORT FLEXHALER 90 MCG/ACT IN AEPB
1.0000 | INHALATION_SPRAY | Freq: Two times a day (BID) | RESPIRATORY_TRACT | 0 refills | Status: DC
  Filled 2023-08-18: qty 1, 30d supply, fill #0

## 2023-09-16 ENCOUNTER — Other Ambulatory Visit (HOSPITAL_BASED_OUTPATIENT_CLINIC_OR_DEPARTMENT_OTHER): Payer: Self-pay

## 2023-09-16 MED ORDER — PREDNISONE 10 MG (21) PO TBPK
ORAL_TABLET | ORAL | 0 refills | Status: DC
  Filled 2023-09-16: qty 21, 6d supply, fill #0

## 2023-09-16 MED ORDER — TIZANIDINE HCL 2 MG PO TABS
2.0000 mg | ORAL_TABLET | Freq: Three times a day (TID) | ORAL | 0 refills | Status: AC
  Filled 2023-09-16: qty 30, 10d supply, fill #0

## 2023-09-17 ENCOUNTER — Emergency Department (HOSPITAL_COMMUNITY)
Admission: EM | Admit: 2023-09-17 | Discharge: 2023-09-17 | Disposition: A | Discharge status: Home / Self Care | Attending: Emergency Medicine | Admitting: Emergency Medicine

## 2023-09-17 ENCOUNTER — Encounter (HOSPITAL_COMMUNITY): Payer: Self-pay

## 2023-09-17 ENCOUNTER — Other Ambulatory Visit: Payer: Self-pay

## 2023-09-17 ENCOUNTER — Emergency Department (HOSPITAL_COMMUNITY)

## 2023-09-17 DIAGNOSIS — M5442 Lumbago with sciatica, left side: Secondary | ICD-10-CM | POA: Diagnosis not present

## 2023-09-17 DIAGNOSIS — N3000 Acute cystitis without hematuria: Secondary | ICD-10-CM

## 2023-09-17 DIAGNOSIS — R300 Dysuria: Secondary | ICD-10-CM | POA: Diagnosis not present

## 2023-09-17 DIAGNOSIS — M5441 Lumbago with sciatica, right side: Secondary | ICD-10-CM | POA: Insufficient documentation

## 2023-09-17 DIAGNOSIS — M545 Low back pain, unspecified: Secondary | ICD-10-CM | POA: Diagnosis present

## 2023-09-17 DIAGNOSIS — X500XXA Overexertion from strenuous movement or load, initial encounter: Secondary | ICD-10-CM | POA: Diagnosis not present

## 2023-09-17 DIAGNOSIS — J45909 Unspecified asthma, uncomplicated: Secondary | ICD-10-CM | POA: Diagnosis not present

## 2023-09-17 LAB — URINALYSIS, ROUTINE W REFLEX MICROSCOPIC
Bilirubin Urine: NEGATIVE
Glucose, UA: NEGATIVE mg/dL
Hgb urine dipstick: NEGATIVE
Ketones, ur: NEGATIVE mg/dL
Nitrite: NEGATIVE
Protein, ur: NEGATIVE mg/dL
Specific Gravity, Urine: 1.011 (ref 1.005–1.030)
pH: 6 (ref 5.0–8.0)

## 2023-09-17 MED ORDER — OXYCODONE HCL 5 MG PO TABS: 5.0000 mg | ORAL_TABLET | Freq: Four times a day (QID) | ORAL | 0 refills | Status: AC | PRN

## 2023-09-17 MED ORDER — ACETAMINOPHEN 500 MG PO TABS: 1000.0000 mg | ORAL_TABLET | Freq: Three times a day (TID) | ORAL | 0 refills | Status: AC | PRN

## 2023-09-17 MED ORDER — OXYCODONE HCL 5 MG PO TABS: 5.0000 mg | ORAL_TABLET | Freq: Four times a day (QID) | ORAL | 0 refills | Status: DC | PRN

## 2023-09-17 MED ORDER — ACETAMINOPHEN 500 MG PO TABS
1000.0000 mg | ORAL_TABLET | Freq: Three times a day (TID) | ORAL | 0 refills | Status: DC | PRN
Start: 2023-09-17 — End: 2023-09-17

## 2023-09-17 MED ORDER — MORPHINE SULFATE (PF) 4 MG/ML IV SOLN: 4.0000 mg | Freq: Once | INTRAVENOUS | Status: DC

## 2023-09-17 MED ORDER — KETOROLAC TROMETHAMINE 15 MG/ML IJ SOLN
15.0000 mg | Freq: Once | INTRAMUSCULAR | Status: AC
  Administered 2023-09-17: 15 mg via INTRAMUSCULAR
  Filled 2023-09-17: qty 1

## 2023-09-17 MED ORDER — DIAZEPAM 5 MG PO TABS: 5.0000 mg | ORAL_TABLET | Freq: Once | ORAL | Status: DC

## 2023-09-17 MED ORDER — IBUPROFEN 600 MG PO TABS: 600.0000 mg | ORAL_TABLET | Freq: Four times a day (QID) | ORAL | 0 refills | Status: AC | PRN

## 2023-09-17 MED ORDER — OXYCODONE HCL 5 MG PO TABS: 5.0000 mg | ORAL_TABLET | Freq: Once | ORAL | Status: DC

## 2023-09-17 MED ORDER — HYDROMORPHONE HCL 1 MG/ML IJ SOLN
0.5000 mg | Freq: Once | INTRAMUSCULAR | Status: AC
  Administered 2023-09-17: 0.5 mg via INTRAMUSCULAR
  Filled 2023-09-17: qty 0.5

## 2023-09-17 MED ORDER — IBUPROFEN 600 MG PO TABS
600.0000 mg | ORAL_TABLET | Freq: Four times a day (QID) | ORAL | 0 refills | Status: DC | PRN
Start: 2023-09-17 — End: 2023-09-17

## 2023-09-17 MED ORDER — DIAZEPAM 2 MG PO TABS
2.0000 mg | ORAL_TABLET | Freq: Once | ORAL | Status: AC
  Administered 2023-09-17: 2 mg via ORAL
  Filled 2023-09-17: qty 1

## 2023-09-17 MED ORDER — NITROFURANTOIN MONOHYD MACRO 100 MG PO CAPS: 100.0000 mg | ORAL_CAPSULE | Freq: Two times a day (BID) | ORAL | 0 refills | Status: DC

## 2023-09-17 MED ORDER — MORPHINE SULFATE (PF) 4 MG/ML IV SOLN
4.0000 mg | Freq: Once | INTRAVENOUS | Status: AC
  Administered 2023-09-17: 4 mg via INTRAMUSCULAR
  Filled 2023-09-17: qty 1

## 2023-09-17 NOTE — ED Triage Notes (Signed)
 Pt was moving boxes a few days ago and stated that her back went out on her yesterday. She saw [facility] doc who said nothing looked out of sorts. Pt stated that she has tried everything from muscle relaxers to pain medicine and she still is unable to move without her back spasming

## 2023-09-17 NOTE — ED Provider Notes (Signed)
 [place] [facility] AT [facility] Provider Note   CSN: [id number] Arrival date & time: 09/17/23  1052     Patient presents with: Back Pain   [name] is a 38 y.o. female.  She has history of degenerative disc disease, asthma, pulmonary hypertension.  She presents ER today complaining of severe low back pain.  States it started 3 days ago after lifting boxes but has been gradually worsening.  Pain is worse with movement and better with rest.  It is not relieved with Tylenol , ibuprofen , Voltaren gel, hydrocodone  or tizanidine .  Denies fever or chills, no nausea vomiting or abdominal pain.  She does admit to some mild dysuria.  She states she was concerned yesterday when she had 2 episodes of bowel and bladder incontinence.  She states the first time she had sudden urge to go to the bathroom and after about 30 seconds, she had mated to the bathroom and lost control of bowels and bladder both.  She states later in the day she noticed that she had loss of control of bowels and bladder both again but had no warning and did not feel the urge to go prior to this.  Pain radiates down posterior aspect of bilateral legs to the level of the ankle.    She went to [facility] yesterday she reports and had x-rays done.  They prescribed the tizanidine  and hydrocodone .  She had x-rays that were normal.  She comes in today due to worsening pain without relief of treatment. She was not able to walk at home and had to crawl to go to the bathroom, had to have assistance walking to the car to get here today.  States she cannot and up or walk without help. She denies saddle anesthesia or paresthesia, she had bowel bladder incontinence yesterday, had felt the urge to go but lost control of bowels and bladder before she get to the bathroom.  Later today it happened a second time without the same morning.    Back Pain      Prior to Admission medications   Medication Sig Start Date End  Date Taking? Authorizing Provider  acetaminophen  (TYLENOL ) 500 MG tablet Take 2 tablets (1,000 mg total) by mouth every 8 (eight) hours as needed for moderate pain (pain score 4-6). 09/17/23  Yes Lakshmi Sundeen A, PA-C  ibuprofen  (ADVIL ) 600 MG tablet Take 1 tablet (600 mg total) by mouth every 6 (six) hours as needed. 09/17/23  Yes Danna Sewell A, PA-C  oxyCODONE  (ROXICODONE ) 5 MG immediate release tablet Take 1 tablet (5 mg total) by mouth every 6 (six) hours as needed for severe pain (pain score 7-10). 09/17/23  Yes Estanislao Harmon A, PA-C  albuterol  (VENTOLIN  HFA) 108 (90 Base) MCG/ACT inhaler Inhale 2 puffs into the lungs every 6 (six) hours as needed for wheezing or shortness of breath. 01/01/22   Tobie Arleta SQUIBB, MD  albuterol  (VENTOLIN  HFA) 108 (90 Base) MCG/ACT inhaler Inhale 2 puffs into the lungs every 6 (six) hours as needed. 01/04/22   Tobie Arleta SQUIBB, MD  ALPRAZolam  (XANAX ) 0.5 MG tablet Take 1 tablet (0.5 mg total) by mouth daily as needed. 08/10/22     azelastine  (ASTELIN ) 0.1 % nasal spray Place 1 spray into both nostrils 2 (two) times daily. Use in each nostril as directed 01/01/22   Tobie Arleta SQUIBB, MD  Azelastine  HCl 137 MCG/SPRAY SOLN Place 1 spray into both nostrils 2 (two) times daily as needed. 01/01/22   Tobie,  Arleta SQUIBB, MD  azithromycin  (ZITHROMAX ) 250 MG tablet Take as directed per package instructions. 06/02/23     benzonatate  (TESSALON ) 200 MG capsule Take 1 capsule (200 mg total) by mouth 3 (three) times daily. 08/30/22     Budesonide  (PULMICORT  FLEXHALER) 90 MCG/ACT inhaler Inhale 1 puff into the lungs 2 (two) times daily. 08/18/23     Budesonide  90 MCG/ACT inhaler Inhale 1 puff into the lungs 2 (two) times daily. 01/04/22   Tobie Arleta SQUIBB, MD  cetirizine  (ZYRTEC ) 10 MG tablet Take 1 tablet (10 mg total) by mouth daily. 01/01/22   Tobie Arleta SQUIBB, MD  cetirizine  (ZYRTEC ) 10 MG tablet Take 1 tablet (10 mg total) by mouth daily. 01/01/22   Tobie Arleta SQUIBB, MD  clindamycin  (CLEOCIN ) 300 MG  capsule Take 1 capsule (300 mg total) by mouth 3 (three) times daily for 10 days. 11/04/22     clobetasol  cream (TEMOVATE ) 0.05 % Apply 1 Application topically 2 (two) times a day. 08/12/23     doxycycline  (MONODOX ) 100 MG capsule Take 1 capsule (100 mg total) by mouth 2 (two) times daily. 01/20/23     EPINEPHrine  0.3 mg/0.3 mL IJ SOAJ injection Inject 0.3 mg into the muscle as needed for anaphylaxis. 01/01/22   Tobie Arleta SQUIBB, MD  EPINEPHrine  0.3 mg/0.3 mL IJ SOAJ injection Inject 0.3 mg into the muscle as needed. 07/08/23   Tobie Arleta SQUIBB, MD  ergocalciferol  (VITAMIN D2) 1.25 MG (50000 UT) capsule Take 1 capsule (50,000 Units total) by mouth once a week. 06/07/22     escitalopram  (LEXAPRO ) 10 MG tablet Take 10 mg by mouth daily. 06/10/21   [provider]  escitalopram  (LEXAPRO ) 10 MG tablet Take 1.5 tablets (15 mg total) by mouth daily. 08/01/23     famotidine  (PEPCID ) 20 MG tablet Take 20 mg by mouth 2 (two) times daily.    [provider]  fluticasone  (FLONASE ) 50 MCG/ACT nasal spray Place 2 sprays into both nostrils daily. 01/01/22   Tobie Arleta SQUIBB, MD  fluticasone  (FLONASE ) 50 MCG/ACT nasal spray Place 2 sprays into both nostrils daily. 01/06/22   Tobie Arleta SQUIBB, MD  fluticasone -salmeterol (ADVAIR HFA) 115-21 MCG/ACT inhaler Inhale 2 puffs into the lungs 2 (two) times daily. 12/07/21     ipratropium (ATROVENT) 0.06 % nasal spray Place 2 sprays into both nostrils 4 (four) times daily. 12/01/21   [provider]  Levothyroxine  Sodium (TIROSINT ) 75 MCG CAPS Take 1 capsule (75 mcg total) by mouth every morning on an empty stomach. 02/20/22     Levothyroxine  Sodium (TIROSINT ) 88 MCG CAPS Take 1 capsule (88 mcg total) by mouth every morning on an empty stomach. 03/09/23     Levothyroxine  Sodium (TIROSINT ) 88 MCG CAPS Take 1 capsule (88 mcg total) by mouth every morning on an empty stomach 03/09/23     methylPREDNISolone  (MEDROL ) 4 MG TBPK tablet Take as directed per package instructions.  06/02/23     montelukast  (SINGULAIR ) 10 MG tablet Take 1 tablet (10 mg total) by mouth once for 1 dose. 01/01/22 01/01/22  Tobie Arleta SQUIBB, MD  montelukast  (SINGULAIR ) 10 MG tablet Take 1 tablet (10 mg total) by mouth daily. 03/23/23     montelukast  (SINGULAIR ) 10 MG tablet Take 1 tablet (10 mg total) by mouth daily. 07/07/23     olopatadine  (PATANOL) 0.1 % ophthalmic solution Place 1 drop into both eyes daily as needed for itchy watery eyes 01/01/22   Tobie Arleta SQUIBB, MD  Olopatadine  HCl 0.2 % SOLN  Apply 1 drop to eye daily as needed (itchy watery eyes). 01/01/22   Tobie Arleta SQUIBB, MD  predniSONE  (DELTASONE ) 10 MG tablet Take 4 tablets (40 mg total) by mouth daily. 01/03/22   Levander Houston, MD  predniSONE  (STERAPRED UNI-PAK 21 TAB) 10 MG (21) TBPK tablet Take 1 dose pk by oral route. 09/16/23     sodium chloride  (OCEAN) 0.65 % SOLN nasal spray Place 1 spray into both nostrils as needed for congestion.    [provider]  TIROSINT  75 MCG CAPS Take 75 mcg by mouth every morning. 02/23/20   [provider]  tiZANidine  (ZANAFLEX ) 2 MG tablet Take 1 tablet (2 mg total) by mouth every 8 (eight) hours for 10 days. 09/16/23 09/26/23    traZODone  (DESYREL ) 100 MG tablet Take 1 tablet (100 mg total) by mouth at bedtime. 07/19/22     traZODone  (DESYREL ) 100 MG tablet Take 1 tablet (100 mg total) by mouth at bedtime. 03/28/23     traZODone  (DESYREL ) 50 MG tablet Take 25-50 mg by mouth at bedtime. 09/06/21   [provider]  fluvoxaMINE (LUVOX) 100 MG tablet Take 100 mg by mouth 2 (two) times daily. 03/04/20 03/11/20  [provider]    Allergies: Avelox [moxifloxacin], Bee venom, Cephalosporins, Food, Penicillins, Sulfa antibiotics, Wheat, and Biaxin [clarithromycin]    Review of Systems  Musculoskeletal:  Positive for back pain.    Updated Vital Signs BP (!) 102/91   Pulse 77   Temp 98.1 F (36.7 C) (Oral)   Resp 18   Ht 5' 10 (1.778 m)   Wt 124.7 kg   SpO2 97%   BMI 39.46 kg/m    Physical Exam Vitals and nursing note reviewed.  Constitutional:      General: She is not in acute distress.    Appearance: She is well-developed.  HENT:     Head: Normocephalic and atraumatic.     Mouth/Throat:     Mouth: Mucous membranes are moist.  Eyes:     Conjunctiva/sclera: Conjunctivae normal.  Cardiovascular:     Rate and Rhythm: Normal rate and regular rhythm.     Heart sounds: No murmur heard. Pulmonary:     Effort: Pulmonary effort is normal. No respiratory distress.     Breath sounds: Normal breath sounds.  Abdominal:     Palpations: Abdomen is soft.     Tenderness: There is no abdominal tenderness.  Musculoskeletal:        General: No swelling.     Cervical back: Neck supple.  Skin:    General: Skin is warm and dry.     Capillary Refill: Capillary refill takes less than 2 seconds.  Neurological:     General: No focal deficit present.     Mental Status: She is alert and oriented to person, place, and time.     Sensory: No sensory deficit.     Motor: No weakness.     Comments: Patient has antalgic gait  Psychiatric:        Mood and Affect: Mood normal.     (all labs ordered are listed, but only abnormal results are displayed) Labs Reviewed  URINALYSIS, ROUTINE W REFLEX MICROSCOPIC - Abnormal; Notable for the following components:      Result Value   APPearance HAZY (*)    Leukocytes,Ua TRACE (*)    Bacteria, UA RARE (*)    All other components within normal limits    EKG: None  Radiology: MR LUMBAR SPINE WO CONTRAST Result  Date: 09/17/2023 CLINICAL DATA:  Low back pain, cauda equina syndrome suspected. Bilateral sciatica, loss of control of bowels and bladder x 2. EXAM: MRI LUMBAR SPINE WITHOUT CONTRAST TECHNIQUE: Multiplanar, multisequence MR imaging of the lumbar spine was performed. No intravenous contrast was administered. COMPARISON:  Lumbar spine MRI 01/24/2013 FINDINGS: Segmentation:  Standard. Alignment: Mild levoscoliosis in the mid upper  lumbar spine. No significant listhesis. Vertebrae: No fracture or suspicious marrow lesion. New prominent left-sided Modic type 1 endplate changes at L4-5. Conus medullaris and cauda equina: Conus extends to the L1 level. Conus and cauda equina appear normal. Paraspinal and other soft tissues: Unremarkable. Disc levels: T12-L1 through L2-3: Normal discs. Up to mild facet hypertrophy without stenosis. L3-4: Disc desiccation and increased, mild disc space narrowing. Disc bulging, a small left central to left subarticular disc protrusion with small annular fissure, and mild facet and ligamentum flavum hypertrophy result in mild spinal stenosis and mild left lateral recess stenosis, mildly progressed. Patent neural foramina. L4-5: Disc desiccation and increased, moderate to severe disc space narrowing on the left. The central disc protrusion on the prior MRI has resolved. There is mild circumferential disc bulging eccentric to the left and mild facet hypertrophy without significant stenosis. L5-S1: Disc desiccation and mild disc space narrowing. Minimal disc bulging, endplate spurring, and mild facet hypertrophy result in mild left neural foraminal stenosis without spinal stenosis, unchanged. Tiny central annular fissure. IMPRESSION: 1. 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 2. New degenerative endplate edema at O5-4. Resolved central disc protrusion without significant residual stenosis. 3. Unchanged mild left neural foraminal stenosis at L5-S1. Electronically Signed   By: Dasie Hamburg M.D.   On: 09/17/2023 12:36     Procedures   Medications Ordered in the ED  ketorolac  (TORADOL ) 15 MG/ML injection 15 mg (15 mg Intramuscular Given 09/17/23 1231)  diazepam  (VALIUM ) tablet 2 mg (2 mg Oral Given 09/17/23 1231)  morphine  (PF) 4 MG/ML injection 4 mg (4 mg Intramuscular Given 09/17/23 1440)  HYDROmorphone  (DILAUDID ) injection 0.5 mg (0.5 mg Intramuscular Given 09/17/23  1537)                                    Medical Decision Making Differential diagnosis includes but not limited to HNP, degenerative disc disease, muscle spasm, strain, cauda equina, epidural abscess, other  Course: Patient is here for low back pain that has severely worsened over the past several days.  She has known degenerative disc disease but states never been this bad.  She was needing assistance to walk this morning and was seen by orthopedics yesterday, had x-rays that were reassuring but she states the pain medicine and muscle relaxer she was given is not helping her.  She is not having fever chills or other infectious symptoms.  She has no saddle anesthesia or paresthesia, she reports bowel bladder incontinence yesterday, at least one of the episodes she had warning but just could not make to the bathroom.  She is having some mild dysuria, feels like prior UTIs, UA shows trace leuks and rare bacteria will treat with Macrobid .   Because of the bowel and bladder dysfunction I did order MRI to rule out possible cauda equina syndrome but this showed worsening degenerative changes but no cauda equina or other emergent cause.  Patient was initially given Toradol  and Valium  for pain without significant relief subsequently given morphine  with partial  relief feeling given half milligram of Dilaudid  patient is feeling much better, she is able to ambulate without assistance after this.  She does have Norco at home.  Advised her severe pain to take the oxycodone  as prescribed and continue taking Tylenol  and ibuprofen .  She states the tizanidine  she was prescribed did not help at all so advised that she not take this especially concurrently with the opiates.  Advised to not take the Norco with the oxycodone .  Advised to not drive with the oxycodone  or drink alcohol.  He is given strict follow-up and return precautions.  Amount and/or Complexity of Data Reviewed Labs: ordered. Radiology:  ordered.  Risk OTC drugs. Prescription drug management.        Final diagnoses:  Acute bilateral low back pain with bilateral sciatica    ED Discharge Orders          Ordered    oxyCODONE  (ROXICODONE ) 5 MG immediate release tablet  Every 6 hours PRN        09/17/23 1621    ibuprofen  (ADVIL ) 600 MG tablet  Every 6 hours PRN        09/17/23 1621    acetaminophen  (TYLENOL ) 500 MG tablet  Every 8 hours PRN        09/17/23 [address], PA-C 09/17/23 1845    Cleotilde Rogue, MD 09/18/23 [phone]

## 2023-09-17 NOTE — ED Notes (Signed)
 Patient transported to MRI

## 2023-09-17 NOTE — Discharge Instructions (Addendum)
 It was a pleasure taking care of you today.  You were seen for severe back pain.  Since you had some loss of control of bowels and bladder we got an MRI that showed   1. 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 2. New degenerative endplate edema at O5-4. Resolved central disc protrusion without significant residual stenosis. 3. Unchanged mild left neural foraminal stenosis at L5-S1.   Fortunately there were no signs of an emergent cause of back pain.  You are not having fevers or chills to suggest infection in your back but you do have GI symptoms so we are giving you antibiotics for this.  Follow-up with orthopedics, come back to the ER for new or worsening symptoms.  You had next take the oxycodone  at 7:30 PM at the earliest.  You were prescribed opiate pain medication. This can have many sided effect such as drowsiness, slowed breathing, and conspitation. Do not take it with other medications that cause drowsiness, or drink alcohol while taking it. Take a stool softener over the counter while taking it.

## 2023-09-24 ENCOUNTER — Other Ambulatory Visit (HOSPITAL_BASED_OUTPATIENT_CLINIC_OR_DEPARTMENT_OTHER): Payer: Self-pay

## 2023-09-24 MED ORDER — OXYCODONE-ACETAMINOPHEN 10-325 MG PO TABS
1.0000 | ORAL_TABLET | Freq: Three times a day (TID) | ORAL | 0 refills | Status: DC | PRN
  Filled 2023-09-24: qty 15, 5d supply, fill #0

## 2023-09-29 ENCOUNTER — Other Ambulatory Visit (HOSPITAL_BASED_OUTPATIENT_CLINIC_OR_DEPARTMENT_OTHER): Payer: Self-pay

## 2023-09-29 ENCOUNTER — Other Ambulatory Visit: Payer: Self-pay

## 2023-09-29 MED ORDER — PULMICORT FLEXHALER 90 MCG/ACT IN AEPB
1.0000 | INHALATION_SPRAY | Freq: Two times a day (BID) | RESPIRATORY_TRACT | 3 refills | Status: AC
  Filled 2023-09-29: qty 1, 30d supply, fill #0
  Filled 2023-11-09: qty 1, 30d supply, fill #1
  Filled 2023-12-21: qty 1, 30d supply, fill #2
  Filled 2024-02-09: qty 1, 30d supply, fill #3

## 2023-09-30 ENCOUNTER — Other Ambulatory Visit (HOSPITAL_BASED_OUTPATIENT_CLINIC_OR_DEPARTMENT_OTHER): Payer: Self-pay

## 2023-09-30 MED ORDER — OXYCODONE-ACETAMINOPHEN 10-325 MG PO TABS
1.0000 | ORAL_TABLET | Freq: Three times a day (TID) | ORAL | 0 refills | Status: AC | PRN
  Filled 2023-09-30: qty 30, 10d supply, fill #0

## 2023-10-08 ENCOUNTER — Other Ambulatory Visit (HOSPITAL_BASED_OUTPATIENT_CLINIC_OR_DEPARTMENT_OTHER): Payer: Self-pay

## 2023-10-10 ENCOUNTER — Other Ambulatory Visit (HOSPITAL_BASED_OUTPATIENT_CLINIC_OR_DEPARTMENT_OTHER): Payer: Self-pay

## 2023-10-10 MED ORDER — AZITHROMYCIN 250 MG PO TABS
ORAL_TABLET | ORAL | 0 refills | Status: DC
  Filled 2023-10-10: qty 6, 5d supply, fill #0

## 2023-10-13 ENCOUNTER — Other Ambulatory Visit (HOSPITAL_BASED_OUTPATIENT_CLINIC_OR_DEPARTMENT_OTHER): Payer: Self-pay

## 2023-10-13 MED ORDER — HYDROCODONE-ACETAMINOPHEN 5-325 MG PO TABS
1.0000 | ORAL_TABLET | Freq: Three times a day (TID) | ORAL | 0 refills | Status: AC
  Filled 2023-10-13: qty 30, 10d supply, fill #0

## 2023-10-13 MED ORDER — ATOMOXETINE HCL 18 MG PO CAPS
36.0000 mg | ORAL_CAPSULE | Freq: Every day | ORAL | 1 refills | Status: AC
  Filled 2023-10-13: qty 60, 30d supply, fill #0
  Filled 2023-11-17: qty 60, 30d supply, fill #1

## 2023-10-14 ENCOUNTER — Other Ambulatory Visit (HOSPITAL_BASED_OUTPATIENT_CLINIC_OR_DEPARTMENT_OTHER): Payer: Self-pay

## 2023-10-15 ENCOUNTER — Other Ambulatory Visit (HOSPITAL_BASED_OUTPATIENT_CLINIC_OR_DEPARTMENT_OTHER): Payer: Self-pay

## 2023-11-09 ENCOUNTER — Other Ambulatory Visit (HOSPITAL_BASED_OUTPATIENT_CLINIC_OR_DEPARTMENT_OTHER): Payer: Self-pay

## 2023-11-10 ENCOUNTER — Other Ambulatory Visit (HOSPITAL_BASED_OUTPATIENT_CLINIC_OR_DEPARTMENT_OTHER): Payer: Self-pay

## 2023-11-11 ENCOUNTER — Other Ambulatory Visit (HOSPITAL_BASED_OUTPATIENT_CLINIC_OR_DEPARTMENT_OTHER): Payer: Self-pay

## 2023-11-17 ENCOUNTER — Other Ambulatory Visit: Payer: Self-pay

## 2023-11-18 ENCOUNTER — Other Ambulatory Visit (HOSPITAL_BASED_OUTPATIENT_CLINIC_OR_DEPARTMENT_OTHER): Payer: Self-pay

## 2023-11-21 ENCOUNTER — Other Ambulatory Visit (HOSPITAL_BASED_OUTPATIENT_CLINIC_OR_DEPARTMENT_OTHER): Payer: Self-pay

## 2023-11-21 ENCOUNTER — Other Ambulatory Visit: Payer: Self-pay

## 2023-11-25 ENCOUNTER — Other Ambulatory Visit (HOSPITAL_BASED_OUTPATIENT_CLINIC_OR_DEPARTMENT_OTHER): Payer: Self-pay

## 2023-11-25 MED ORDER — DOXYCYCLINE HYCLATE 100 MG PO TABS
100.0000 mg | ORAL_TABLET | Freq: Two times a day (BID) | ORAL | 0 refills | Status: DC
  Filled 2023-11-25: qty 20, 10d supply, fill #0

## 2023-11-30 ENCOUNTER — Other Ambulatory Visit: Payer: Self-pay | Admitting: Internal Medicine

## 2023-11-30 ENCOUNTER — Other Ambulatory Visit (HOSPITAL_BASED_OUTPATIENT_CLINIC_OR_DEPARTMENT_OTHER): Payer: Self-pay

## 2023-11-30 MED ORDER — OMEPRAZOLE 40 MG PO CPDR
40.0000 mg | DELAYED_RELEASE_CAPSULE | Freq: Every morning | ORAL | 1 refills | Status: AC
  Filled 2023-11-30: qty 30, 30d supply, fill #0

## 2023-12-01 ENCOUNTER — Other Ambulatory Visit (HOSPITAL_BASED_OUTPATIENT_CLINIC_OR_DEPARTMENT_OTHER): Payer: Self-pay

## 2023-12-07 ENCOUNTER — Other Ambulatory Visit (HOSPITAL_BASED_OUTPATIENT_CLINIC_OR_DEPARTMENT_OTHER): Payer: Self-pay

## 2023-12-10 ENCOUNTER — Other Ambulatory Visit (HOSPITAL_BASED_OUTPATIENT_CLINIC_OR_DEPARTMENT_OTHER): Payer: Self-pay

## 2023-12-10 MED ORDER — SUTAB 1479-225-188 MG PO TABS
ORAL_TABLET | ORAL | 0 refills | Status: AC
  Filled 2023-12-10: qty 24, 1d supply, fill #0

## 2023-12-13 ENCOUNTER — Other Ambulatory Visit (HOSPITAL_BASED_OUTPATIENT_CLINIC_OR_DEPARTMENT_OTHER): Payer: Self-pay

## 2023-12-15 ENCOUNTER — Other Ambulatory Visit (HOSPITAL_BASED_OUTPATIENT_CLINIC_OR_DEPARTMENT_OTHER): Payer: Self-pay

## 2023-12-21 ENCOUNTER — Other Ambulatory Visit: Payer: Self-pay | Admitting: Internal Medicine

## 2023-12-21 ENCOUNTER — Other Ambulatory Visit (HOSPITAL_BASED_OUTPATIENT_CLINIC_OR_DEPARTMENT_OTHER): Payer: Self-pay

## 2023-12-21 ENCOUNTER — Other Ambulatory Visit: Payer: Self-pay

## 2023-12-21 MED ORDER — ALPRAZOLAM 0.5 MG PO TABS
0.5000 mg | ORAL_TABLET | Freq: Every day | ORAL | 0 refills | Status: AC | PRN
  Filled 2023-12-21: qty 30, 30d supply, fill #0

## 2023-12-22 ENCOUNTER — Other Ambulatory Visit: Payer: Self-pay

## 2023-12-22 ENCOUNTER — Other Ambulatory Visit (HOSPITAL_BASED_OUTPATIENT_CLINIC_OR_DEPARTMENT_OTHER): Payer: Self-pay

## 2023-12-26 ENCOUNTER — Other Ambulatory Visit (HOSPITAL_BASED_OUTPATIENT_CLINIC_OR_DEPARTMENT_OTHER): Payer: Self-pay

## 2023-12-26 MED ORDER — ATOMOXETINE HCL 40 MG PO CAPS
40.0000 mg | ORAL_CAPSULE | Freq: Every day | ORAL | 1 refills | Status: DC
  Filled 2023-12-26: qty 90, 90d supply, fill #0

## 2023-12-27 ENCOUNTER — Other Ambulatory Visit (HOSPITAL_BASED_OUTPATIENT_CLINIC_OR_DEPARTMENT_OTHER): Payer: Self-pay

## 2023-12-28 ENCOUNTER — Other Ambulatory Visit (HOSPITAL_BASED_OUTPATIENT_CLINIC_OR_DEPARTMENT_OTHER): Payer: Self-pay

## 2023-12-28 MED ORDER — FLUTICASONE PROPIONATE 50 MCG/ACT NA SUSP
1.0000 | Freq: Every day | NASAL | 5 refills | Status: AC
  Filled 2023-12-28: qty 16, 30d supply, fill #0
  Filled 2024-02-09: qty 16, 30d supply, fill #1

## 2023-12-30 ENCOUNTER — Other Ambulatory Visit (HOSPITAL_BASED_OUTPATIENT_CLINIC_OR_DEPARTMENT_OTHER): Payer: Self-pay

## 2024-01-03 ENCOUNTER — Other Ambulatory Visit (HOSPITAL_BASED_OUTPATIENT_CLINIC_OR_DEPARTMENT_OTHER): Payer: Self-pay

## 2024-01-03 MED ORDER — DIAZEPAM 5 MG PO TABS
5.0000 mg | ORAL_TABLET | Freq: Three times a day (TID) | ORAL | 0 refills | Status: AC | PRN
  Filled 2024-01-03: qty 30, 10d supply, fill #0

## 2024-01-04 ENCOUNTER — Encounter (INDEPENDENT_AMBULATORY_CARE_PROVIDER_SITE_OTHER): Payer: Self-pay

## 2024-01-16 ENCOUNTER — Ambulatory Visit (INDEPENDENT_AMBULATORY_CARE_PROVIDER_SITE_OTHER)

## 2024-01-16 ENCOUNTER — Other Ambulatory Visit (HOSPITAL_BASED_OUTPATIENT_CLINIC_OR_DEPARTMENT_OTHER): Payer: Self-pay

## 2024-01-16 VITALS — BP 108/78 | HR 89 | Temp 98.0°F | Wt 275.0 lb

## 2024-01-16 DIAGNOSIS — J342 Deviated nasal septum: Secondary | ICD-10-CM

## 2024-01-16 DIAGNOSIS — K219 Gastro-esophageal reflux disease without esophagitis: Secondary | ICD-10-CM

## 2024-01-16 DIAGNOSIS — J329 Chronic sinusitis, unspecified: Secondary | ICD-10-CM

## 2024-01-16 DIAGNOSIS — J3089 Other allergic rhinitis: Secondary | ICD-10-CM

## 2024-01-16 DIAGNOSIS — J4530 Mild persistent asthma, uncomplicated: Secondary | ICD-10-CM

## 2024-01-16 DIAGNOSIS — R0981 Nasal congestion: Secondary | ICD-10-CM

## 2024-01-16 MED ORDER — DOXYCYCLINE HYCLATE 100 MG PO TABS
100.0000 mg | ORAL_TABLET | Freq: Two times a day (BID) | ORAL | 0 refills | Status: DC
  Filled 2024-01-16: qty 28, 14d supply, fill #0

## 2024-01-16 NOTE — Progress Notes (Unsigned)
 Dear Dr. Frutoso, Here is my assessment for our mutual patient, [name]. Thank you for allowing me the opportunity to care for your patient. Please do not hesitate to contact me should you have any other questions. Sincerely, Dr. Penne Croak  Otolaryngology Clinic Note Referring provider: Dr. Frutoso HPI:  Discussed the use of AI scribe software for clinical note transcription with the patient, who gave verbal consent to proceed.  History of Present Illness [name] is a 38 year old female with chronic sinusitis, allergic rhinitis, and asthma who presents for evaluation of recurrent sinus infections and nasal obstruction.  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, contributing to frequent exposure to respiratory pathogens  Chronic nasal congestion and structural nasal symptoms - Chronic nasal congestion and longstanding nasal obstruction and stuffiness - Voice sounds congested - Previously evaluated by otolaryngology, including a CT scan of the sinuses reported as normal - Allergist recommended second opinion due to possible structural changes - No prior nasal endoscopy - History of tonsillectomy and adenoidectomy  at age [age]  Migraine headaches - Frequent headaches, described as migraines, occurring three to four days per week - Headaches often localized above the eyebrows - Improvement after tongue tie release, but recurrence over the past six months - Treated with acetaminophen  and heating pad for severe episodes - Mother with history of severe migraines and TIA  Allergic rhinitis and atopy - Allergic rhinitis present - Bee allergy  - Unable to complete allergy  immunotherapy due to frequent illnesses and antibiotic use  Celiac disease and gastrointestinal symptoms - Celiac disease diagnosed by biopsy; gluten-free for eight years - Recent endoscopy and biopsy showed persistent disease - Recent colonoscopy with polyp pending pathology  Sleep disturbance - Difficulty sleeping, previously used trazodone  - Continues to wake at 1 AM  Psychiatric comorbidities - Anxiety and ADHD, treated with escitalopram   Other medical history - Asthma - No current tobacco use, but history of smoking - Not pregnant, uses IUD for contraception  Independent Review of Additional Tests or Records:  Reviewed external note from referring PCP, Sharma,describing relevant history incorporated into todays evaluation.   PMH/Meds/All/SocHx/FamHx/ROS:   Past Medical History:  Diagnosis Date   Anemia    Anxiety    Asthma    chronic bronchitis   Celiac disease    Cervical polyp    COVID    Endometrial polyp    Headache    Infection    UTI   Kidney stones      Past Surgical History:  Procedure Laterality Date   ADENOIDECTOMY     CESAREAN SECTION N/A 09/06/2018   Procedure: CESAREAN SECTION;  Surgeon: Kandyce Sor, MD;  Location: [facility] LD ORS;  Service: Obstetrics;  Laterality: N/A;   DILATION AND CURETTAGE OF UTERUS  01/2017   polyp removed  FOOT SURGERY Left    HIP SURGERY     TONSILLECTOMY      Family History  Problem Relation Age of Onset   Transient ischemic attack Mother 14   Hyperlipidemia  Mother 33   Mitral valve prolapse Mother    Thyroid  disease Mother    Asthma Mother    Obesity Father    Hearing loss Father    Celiac disease Father    Celiac disease Brother    Diabetes Maternal Grandmother    Hyperlipidemia Maternal Grandmother    Kidney disease Maternal Grandmother    Skin cancer Paternal Grandfather    Heart disease Paternal Grandfather    Heart attack Maternal Grandfather 66   Emphysema Maternal Grandfather        smoked   Alpha-1 antitrypsin deficiency Maternal Grandfather    COPD Maternal Grandfather    Other Paternal Grandmother        neuroendocrine cancer   Skin cancer Paternal Grandmother    Hypertension Paternal Grandmother    Thyroid  disease Sister    Cystic fibrosis Sister      Social Connections: Not on file     Current Medications[1]   Physical Exam:   BP 108/78 (BP Location: Right Arm, Patient Position: Sitting, Cuff Size: Normal)   Pulse 89   Temp 98 F (36.7 C)   Wt 275 lb (124.7 kg)   SpO2 96%   BMI 39.46 kg/m   The patient was awake, alert, and appropriate. The external ears were inspected, and otoscopy was performed to evaluate the external auditory canals and tympanic membranes. The nasal cavity and septum were examined for mucosal changes, obstruction, or discharge. The oral cavity and oropharynx were inspected for mucosal lesions, infection, or tonsillar hypertrophy. The neck was palpated for lymphadenopathy, thyroid  abnormalities, or other masses. Cranial nerve function was grossly intact.  Pertinent Findings: General: Well developed, well nourished. No acute distress. Voice without hoarseness Head/Face: Normocephalic. No sinus tenderness. Facial nerve intact and equal bilaterally. No facial lacerations. Eyes: PERRL, no scleral icterus or conjunctival hemorrhage. EOMI. Ears: No gross deformity. Normal external canal. Tympanic membrane with normal landmarks bilaterally Hearing: Normal speech reception.  Nose: deviated nasal  septum. No purulent discharge. Bilateral inf turbinate hypertrophy.  Mouth/Oropharynx: Lips without any lesions. Dentition good. No mucosal lesions within the oropharynx. No tonsillar enlargement, exudate, or lesions. Pharyngeal walls symmetrical. Uvula midline. Tongue midline without lesions. Larynx: See TFL if applicable Nasopharynx: See TFL if applicable Neck: Trachea midline. No masses. No thyromegaly or nodules palpated. No crepitus. Lymphatic: No lymphadenopathy in the neck. Respiratory: No stridor or distress. Room air. Cardiovascular: Regular rate and rhythm. Extremities: No edema or cyanosis. Warm and well-perfused. Skin: No scars or lesions on face or neck. Neurologic: CN II-XII grossly intact. Moving all extremities without gross abnormality. Other:  Physical Exam HEENT: Atraumatic, normocephalic. External ears with no lesions. Tympanic membrane with normal landmarks. Nose with deviated septum. Throat examined.  Seprately Identifiable Procedures:  I personally ordered, reviewed and interpreted the following with the patient today  Given the patient's symptoms and incomplete visualization of critical sinonasal areas with anterior rhinoscopy, a separately performed diagnostic nasal endoscopy procedure is indicated for a complete rhinologic evaluation per American Rhinologic Society recommendations ([email or web address]://[email or web address]-rhinologic.org/position-statements)  I personally ordered, reviewed and interpreted the following with the patient today  Procedure Note Diagnostic Nasal Endoscopy CPT CODE -- 68768 - Mod 25  Prior to initiating any procedures, risks/benefits/alternatives were explained to the patient and verbal consent obtained.  Pre-procedure  diagnosis: Concern for sinusitis Post-procedure diagnosis: same Indication: See pre-procedure diagnosis and physical exam above Complications: None apparent EBL: 0 mL Anesthesia: Lidocaine  4% and topical decongestant was topically  sprayed in each nasal cavity  Description of Procedure:  Patient was identified. A flexible fiberoptic endoscope was utilized to evaluate the sinonasal cavities, mucosa, sinus ostia and turbinates and septum.  Overall, signs of mucosal inflammation are noted.  Also noted are deviated nasal septum and edema.  No mucopurulence, polyps, or masses noted.   Right Middle meatus: edema Right SE Recess: clear drainage Left MM: edema Left SE Recess: clear drainage Photodocumentation was obtained.  Procedure Note Pre-procedure diagnosis:  sore throat Post-procedure diagnosis: Same Procedure: Transnasal Fiberoptic Laryngoscopy, CPT 31575 - Mod 25 Indication: sore throat and post nasal drip Complications: None apparent EBL: 0 mL  The procedure was undertaken to further evaluate the patient's complaint of sore throat with no improvement, with mirror exam inadequate for appropriate examination due to gag reflex and poor patient tolerance  Procedure:  Patient was identified as correct patient. Verbal consent was obtained. The nose was sprayed with oxymetazoline and 4% lidocaine . The The flexible laryngoscope was passed through the nose to view the nasal cavity, pharynx (oropharynx, hypopharynx) and larynx.  The larynx was examined at rest and during multiple phonatory tasks. Documentation was obtained and reviewed with patient. The scope was removed. The patient tolerated the procedure well.  Findings: The nasal cavity and nasopharynx did not reveal any masses or lesions, mucosa appeared to be without obvious lesions. The tongue base, pharyngeal walls, piriform sinuses, vallecula, epiglottis and postcricoid region are normal in appearance EXCEPT: interarytenoid edema and ertyema. The visualized portion of the subglottis and proximal trachea is widely patent. The vocal folds are mobile bilaterally. There are no lesions on the free edge of the vocal folds nor elsewhere in the larynx worrisome for malignancy.     Electronically signed by: Penne Croak, DO 01/18/2024 4:43 PM   Impression & Plans:  Letasha Kershaw is a 38 y.o. female  1. Mild persistent chronic asthma without complication   2. Seasonal and perennial allergic rhinitis   3. Gastroesophageal reflux disease without esophagitis   4. Nasal congestion   5. DNS (deviated nasal septum)   6. Sinusitis, unspecified chronicity, unspecified location    - Findings and diagnoses discussed in detail with the patient. - Risks, benefits, and alternatives were reviewed. Through shared decision making, the patient elects to proceed with below. Assessment & Plan Chronic recurrent sinusitis with deviated nasal septum Chronic recurrent sinusitis with frequent infections and markedly deviated nasal septum confirmed on nasal endoscopy. Possible underlying immunodeficiency considered due to infection frequency. Surgical intervention may be considered if imaging supports structural etiology. - Ordered CT scan of the sinuses to evaluate structural abnormalities and extent of sinus disease. - Ordered blood work including additional immunoglobulin levels to assess for immunodeficiency. - Prescribed doxycycline  for 14 days due to allergies to penicillin, cephalosporins, and sulfa drugs. - Increased Flonase  to two sprays twice daily. - Planned follow-up after CT scan in six weeks to review imaging and discuss further management, including possible surgical intervention for deviated septum if indicated.  Allergic rhinitis Allergic rhinitis managed with daily antihistamines, Singulair , and Flonase . Environmental exposures likely exacerbate symptoms and contribute to chronic sinus issues. - Instructed to increase Flonase  to two sprays twice daily. - Continue current allergy  medications including daily antihistamine and Singulair .  Migraine Frequent migraines with headaches three to four days per week and family history  of severe migraines. Nortriptyline  considered for prophylaxis and sleep support but not initiated due to current use of escitalopram . - Recommended discussion with primary care provider about switching from escitalopram  to nortriptyline for migraine prophylaxis and sleep support.  - Orders placed:  Orders Placed This Encounter  Procedures   CT SINUS WO CONTRAST   - Medications prescribed/continued/adjusted:  Meds ordered this encounter  Medications   doxycycline  (VIBRA -TABS) 100 MG tablet    Sig: Take 1 tablet (100 mg total) by mouth 2 (two) times daily.    Dispense:  28 tablet    Refill:  0   - Education materials provided to the patient. - Follow up: 6 weeks. Patient instructed to return sooner or go to the ED if new/worsening symptoms develop.  Thank you for allowing me the opportunity to care for your patient. Please do not hesitate to contact me should you have any other questions.  Sincerely, Penne Croak, DO Otolaryngologist (ENT) [facility] Phone: [phone] Fax: [phone]  01/18/2024, 4:43 PM        [1]  Current Outpatient Medications:    albuterol  (VENTOLIN  HFA) 108 (90 Base) MCG/ACT inhaler, Inhale 2 puffs into the lungs every 6 (six) hours as needed for wheezing or shortness of breath., Disp: 8 g, Rfl: 1   ALPRAZolam  (XANAX ) 0.5 MG tablet, Take 1 tablet (0.5 mg total) by mouth daily as needed., Disp: 30 tablet, Rfl: 2   atomoxetine  (STRATTERA ) 40 MG capsule, Take 1 capsule by mouth daily, Disp: 90 capsule, Rfl: 1   Budesonide  (PULMICORT  FLEXHALER) 90 MCG/ACT inhaler, Inhale 1 puff into the lungs 2 (two) times daily., Disp: 1 each, Rfl: 3   diazepam  (VALIUM ) 5 MG tablet, Take 1 tablet (5 mg total) by mouth every 8 (eight) hours as needed., Disp: 30 tablet, Rfl: 0   doxycycline  (VIBRA -TABS) 100 MG tablet, Take 1 tablet (100 mg total) by mouth 2 (two) times daily., Disp: 28 tablet, Rfl: 0   EPINEPHrine  0.3 mg/0.3 mL IJ SOAJ injection, Inject 0.3 mg into the muscle as needed.,  Disp: 2 each, Rfl: 1   ergocalciferol  (VITAMIN D2) 1.25 MG (50000 UT) capsule, Take 1 capsule (50,000 Units total) by mouth once a week., Disp: 8 capsule, Rfl: 0   escitalopram  (LEXAPRO ) 10 MG tablet, Take 1.5 tablets (15 mg total) by mouth daily., Disp: 135 tablet, Rfl: 1   famotidine  (PEPCID ) 20 MG tablet, Take 20 mg by mouth 2 (two) times daily., Disp: , Rfl:    fluticasone  (FLONASE ) 50 MCG/ACT nasal spray, Place 2 sprays into both nostrils daily., Disp: 16 g, Rfl: 5   Levothyroxine  Sodium (TIROSINT ) 88 MCG CAPS, Take 1 capsule (88 mcg total) by mouth every morning on an empty stomach., Disp: 90 capsule, Rfl: 3   montelukast  (SINGULAIR ) 10 MG tablet, Take 1 tablet (10 mg total) by mouth once for 1 dose., Disp: 1 tablet, Rfl: 0   traZODone  (DESYREL ) 100 MG tablet, Take 1 tablet (100 mg total) by mouth at bedtime., Disp: 90 tablet, Rfl: 0   acetaminophen  (TYLENOL ) 500 MG tablet, Take 2 tablets (1,000 mg total) by mouth every 8 (eight) hours as needed for moderate pain (pain score 4-6). (Patient not taking: Reported on 01/16/2024), Disp: 30 tablet, Rfl: 0   albuterol  (VENTOLIN  HFA) 108 (90 Base) MCG/ACT inhaler, Inhale 2 puffs into the lungs every 6 (six) hours as needed. (Patient not taking: Reported on 01/16/2024), Disp: 18 g, Rfl: 1   ALPRAZolam  (XANAX ) 0.5 MG tablet, Take 1  tablet (0.5 mg total) by mouth daily as needed. Need appointment with provider for follow-up. (Patient not taking: Reported on 01/16/2024), Disp: 30 tablet, Rfl: 0   atomoxetine  (STRATTERA ) 18 MG capsule, Take 2 capsules (36 mg total) by mouth daily. (Patient not taking: Reported on 01/16/2024), Disp: 60 capsule, Rfl: 1   azelastine  (ASTELIN ) 0.1 % nasal spray, Place 1 spray into both nostrils 2 (two) times daily. Use in each nostril as directed (Patient not taking: Reported on 01/16/2024), Disp: 30 mL, Rfl: 5   Azelastine  HCl 137 MCG/SPRAY SOLN, Place 1 spray into both nostrils 2 (two) times daily as needed. (Patient not taking:  Reported on 01/16/2024), Disp: 30 mL, Rfl: 2   benzonatate  (TESSALON ) 200 MG capsule, Take 1 capsule (200 mg total) by mouth 3 (three) times daily. (Patient not taking: Reported on 01/16/2024), Disp: 45 capsule, Rfl: 0   Budesonide  90 MCG/ACT inhaler, Inhale 1 puff into the lungs 2 (two) times daily. (Patient not taking: Reported on 01/16/2024), Disp: 1 each, Rfl: 5   cetirizine  (ZYRTEC ) 10 MG tablet, Take 1 tablet (10 mg total) by mouth daily. (Patient not taking: Reported on 01/16/2024), Disp: 30 tablet, Rfl: 5   cetirizine  (ZYRTEC ) 10 MG tablet, Take 1 tablet (10 mg total) by mouth daily. (Patient not taking: Reported on 01/16/2024), Disp: 30 tablet, Rfl: 5   clobetasol  cream (TEMOVATE ) 0.05 %, Apply 1 Application topically 2 (two) times a day. (Patient not taking: Reported on 01/16/2024), Disp: 15 g, Rfl: 0   EPINEPHrine  0.3 mg/0.3 mL IJ SOAJ injection, Inject 0.3 mg into the muscle as needed for anaphylaxis. (Patient not taking: Reported on 01/16/2024), Disp: 1 each, Rfl: 1   escitalopram  (LEXAPRO ) 10 MG tablet, Take 10 mg by mouth daily., Disp: , Rfl:    fluticasone  (FLONASE ) 50 MCG/ACT nasal spray, Place 2 sprays into both nostrils daily. (Patient not taking: Reported on 01/16/2024), Disp: 16 g, Rfl: 2   fluticasone  (FLONASE ) 50 MCG/ACT nasal spray, Place 1-2 sprays into both nostrils daily. (Patient not taking: Reported on 01/16/2024), Disp: 16 g, Rfl: 5   fluticasone -salmeterol (ADVAIR HFA) 115-21 MCG/ACT inhaler, Inhale 2 puffs into the lungs 2 (two) times daily. (Patient not taking: Reported on 01/16/2024), Disp: 12 g, Rfl: 1   HYDROcodone -acetaminophen  (NORCO/VICODIN) 5-325 MG tablet, Take 1 tablet by mouth 3 (three) times daily. (Patient not taking: Reported on 01/16/2024), Disp: 30 tablet, Rfl: 0   ibuprofen  (ADVIL ) 600 MG tablet, Take 1 tablet (600 mg total) by mouth every 6 (six) hours as needed. (Patient not taking: Reported on 01/16/2024), Disp: 30 tablet, Rfl: 0   ipratropium  (ATROVENT) 0.06 % nasal spray, Place 2 sprays into both nostrils 4 (four) times daily. (Patient not taking: Reported on 01/16/2024), Disp: , Rfl:    Levothyroxine  Sodium (TIROSINT ) 75 MCG CAPS, Take 1 capsule (75 mcg total) by mouth every morning on an empty stomach. (Patient not taking: Reported on 01/16/2024), Disp: 30 capsule, Rfl: 3   Levothyroxine  Sodium (TIROSINT ) 88 MCG CAPS, Take 1 capsule (88 mcg total) by mouth every morning on an empty stomach (Patient not taking: Reported on 01/16/2024), Disp: 90 capsule, Rfl: 3   methylPREDNISolone  (MEDROL ) 4 MG TBPK tablet, Take as directed per package instructions. (Patient not taking: Reported on 01/16/2024), Disp: 21 each, Rfl: 1   montelukast  (SINGULAIR ) 10 MG tablet, Take 1 tablet (10 mg total) by mouth daily., Disp: 90 tablet, Rfl: 1   montelukast  (SINGULAIR ) 10 MG tablet, Take 1 tablet (10 mg total) by mouth  daily. (Patient not taking: Reported on 01/16/2024), Disp: 90 tablet, Rfl: 1   olopatadine  (PATANOL) 0.1 % ophthalmic solution, Place 1 drop into both eyes daily as needed for itchy watery eyes (Patient not taking: Reported on 01/16/2024), Disp: 2.5 mL, Rfl: 2   Olopatadine  HCl 0.2 % SOLN, Apply 1 drop to eye daily as needed (itchy watery eyes). (Patient not taking: Reported on 01/16/2024), Disp: 2.5 mL, Rfl: 5   omeprazole  (PRILOSEC) 40 MG capsule, Take 1 capsule (40 mg total) by mouth every morning 1/2 to 1 hour before morning meal. (Patient not taking: Reported on 01/16/2024), Disp: 30 capsule, Rfl: 1   oxyCODONE  (ROXICODONE ) 5 MG immediate release tablet, Take 1 tablet (5 mg total) by mouth every 6 (six) hours as needed for severe pain (pain score 7-10). (Patient not taking: Reported on 01/16/2024), Disp: 10 tablet, Rfl: 0   predniSONE  (DELTASONE ) 10 MG tablet, Take 4 tablets (40 mg total) by mouth daily. (Patient not taking: Reported on 01/16/2024), Disp: 20 tablet, Rfl: 0   sodium chloride  (OCEAN) 0.65 % SOLN nasal spray, Place 1 spray into  both nostrils as needed for congestion. (Patient not taking: Reported on 01/16/2024), Disp: , Rfl:    Sodium Sulfate-Mag Sulfate-KCl (SUTAB ) 1479-225-188 MG TABS, Use as directed per package/doctor instructions. (Patient not taking: Reported on 01/16/2024), Disp: 24 tablet, Rfl: 0   TIROSINT  75 MCG CAPS, Take 75 mcg by mouth every morning. (Patient not taking: Reported on 01/16/2024), Disp: , Rfl:    traZODone  (DESYREL ) 100 MG tablet, Take 1 tablet (100 mg total) by mouth at bedtime. (Patient not taking: Reported on 01/16/2024), Disp: 90 tablet, Rfl: 3   traZODone  (DESYREL ) 50 MG tablet, Take 25-50 mg by mouth at bedtime. (Patient not taking: Reported on 01/16/2024), Disp: , Rfl:

## 2024-01-19 ENCOUNTER — Ambulatory Visit (HOSPITAL_BASED_OUTPATIENT_CLINIC_OR_DEPARTMENT_OTHER): Admission: RE | Admit: 2024-01-19 | Discharge: 2024-01-19

## 2024-01-19 DIAGNOSIS — J329 Chronic sinusitis, unspecified: Secondary | ICD-10-CM | POA: Insufficient documentation

## 2024-01-30 ENCOUNTER — Other Ambulatory Visit (HOSPITAL_COMMUNITY): Payer: Self-pay

## 2024-02-09 ENCOUNTER — Other Ambulatory Visit (HOSPITAL_BASED_OUTPATIENT_CLINIC_OR_DEPARTMENT_OTHER): Payer: Self-pay

## 2024-02-09 ENCOUNTER — Other Ambulatory Visit: Payer: Self-pay

## 2024-02-10 ENCOUNTER — Other Ambulatory Visit (HOSPITAL_BASED_OUTPATIENT_CLINIC_OR_DEPARTMENT_OTHER): Payer: Self-pay

## 2024-02-11 ENCOUNTER — Other Ambulatory Visit (HOSPITAL_BASED_OUTPATIENT_CLINIC_OR_DEPARTMENT_OTHER): Payer: Self-pay

## 2024-02-13 ENCOUNTER — Other Ambulatory Visit (HOSPITAL_BASED_OUTPATIENT_CLINIC_OR_DEPARTMENT_OTHER): Payer: Self-pay

## 2024-02-13 ENCOUNTER — Other Ambulatory Visit: Payer: Self-pay

## 2024-02-14 ENCOUNTER — Other Ambulatory Visit (HOSPITAL_BASED_OUTPATIENT_CLINIC_OR_DEPARTMENT_OTHER): Payer: Self-pay

## 2024-02-14 MED ORDER — FLUTICASONE PROPIONATE HFA 110 MCG/ACT IN AERO
2.0000 | INHALATION_SPRAY | Freq: Two times a day (BID) | RESPIRATORY_TRACT | 3 refills | Status: AC
  Filled 2024-02-14: qty 12, 30d supply, fill #0

## 2024-02-20 ENCOUNTER — Other Ambulatory Visit (HOSPITAL_BASED_OUTPATIENT_CLINIC_OR_DEPARTMENT_OTHER): Payer: Self-pay

## 2024-02-20 MED ORDER — LO LOESTRIN FE 1 MG-10 MCG / 10 MCG PO TABS
1.0000 | ORAL_TABLET | Freq: Every day | ORAL | 1 refills | Status: AC
  Filled 2024-02-20: qty 28, 28d supply, fill #0

## 2024-03-01 ENCOUNTER — Other Ambulatory Visit (HOSPITAL_BASED_OUTPATIENT_CLINIC_OR_DEPARTMENT_OTHER): Payer: Self-pay

## 2024-03-01 MED ORDER — ATOMOXETINE HCL 60 MG PO CAPS
60.0000 mg | ORAL_CAPSULE | Freq: Every day | ORAL | 1 refills | Status: AC
  Filled 2024-03-01: qty 30, 30d supply, fill #0

## 2024-03-02 ENCOUNTER — Other Ambulatory Visit (HOSPITAL_BASED_OUTPATIENT_CLINIC_OR_DEPARTMENT_OTHER): Payer: Self-pay

## 2024-03-02 ENCOUNTER — Ambulatory Visit (INDEPENDENT_AMBULATORY_CARE_PROVIDER_SITE_OTHER)

## 2024-03-02 ENCOUNTER — Encounter (INDEPENDENT_AMBULATORY_CARE_PROVIDER_SITE_OTHER): Payer: Self-pay

## 2024-03-02 VITALS — BP 107/73 | HR 85 | Wt 275.0 lb

## 2024-03-02 DIAGNOSIS — J342 Deviated nasal septum: Secondary | ICD-10-CM | POA: Diagnosis not present

## 2024-03-02 DIAGNOSIS — J0121 Acute recurrent ethmoidal sinusitis: Secondary | ICD-10-CM

## 2024-03-02 DIAGNOSIS — K219 Gastro-esophageal reflux disease without esophagitis: Secondary | ICD-10-CM

## 2024-03-02 DIAGNOSIS — H608X3 Other otitis externa, bilateral: Secondary | ICD-10-CM

## 2024-03-02 DIAGNOSIS — J3089 Other allergic rhinitis: Secondary | ICD-10-CM | POA: Diagnosis not present

## 2024-03-02 DIAGNOSIS — J4530 Mild persistent asthma, uncomplicated: Secondary | ICD-10-CM

## 2024-03-02 DIAGNOSIS — J343 Hypertrophy of nasal turbinates: Secondary | ICD-10-CM

## 2024-03-02 DIAGNOSIS — J0141 Acute recurrent pansinusitis: Secondary | ICD-10-CM

## 2024-03-02 DIAGNOSIS — J329 Chronic sinusitis, unspecified: Secondary | ICD-10-CM

## 2024-03-02 DIAGNOSIS — J0101 Acute recurrent maxillary sinusitis: Secondary | ICD-10-CM

## 2024-03-02 DIAGNOSIS — R0981 Nasal congestion: Secondary | ICD-10-CM | POA: Diagnosis not present

## 2024-03-02 MED ORDER — DOXYCYCLINE HYCLATE 100 MG PO TABS
100.0000 mg | ORAL_TABLET | Freq: Two times a day (BID) | ORAL | 0 refills | Status: AC
  Filled 2024-03-02: qty 28, 14d supply, fill #0

## 2024-03-02 NOTE — Patient Instructions (Signed)
" °  VISIT SUMMARY: During your visit, we discussed your chronic sinusitis, deviated nasal septum, allergic rhinitis, and dry, irritated external ear canals. We reviewed your symptoms, recent treatments, and the results of your CT scan. We also discussed potential surgical interventions and other treatment options to help manage your conditions.  YOUR PLAN: -CHRONIC SINUSITIS WITH RECURRENT ACUTE EXACERBATIONS: Chronic sinusitis is a condition where the sinuses are inflamed for a long time, leading to frequent infections. We have ordered an immunodeficiency panel to check for underlying issues. If your symptoms do not improve in three days, you should start another course of doxycycline . We also discussed the possibility of functional endoscopic sinus surgery to help alleviate your symptoms, and we have sent the surgery order for insurance approval and scheduling.  -DEVIATED NASAL SEPTUM: A deviated nasal septum occurs when the thin wall between your nasal passages is displaced to one side, causing nasal obstruction and worsening sinusitis. We discussed the option of septoplasty, a surgical procedure to correct the deviation. Postoperative care will include nasal splints and activity restrictions to ensure proper healing.  -ALLERGIC RHINITIS: Allergic rhinitis is an allergic reaction that causes sneezing, congestion, and a runny nose. It may be contributing to your sinonasal symptoms. We recommend continuing the use of intranasal corticosteroids to manage these symptoms.  -DRY, IRRITATED EXTERNAL EAR CANALS: Your dry and itchy ear canals are likely due to irritation rather than infection. We have prescribed topical steroid ear drops to use as needed to relieve the irritation.  INSTRUCTIONS: Please follow up with us  if your symptoms do not improve in three days, and start the doxycycline  as discussed. We will contact you once the surgery is approved and scheduled. Continue using the intranasal  corticosteroids for your allergic rhinitis and the topical steroid ear drops for your ear canal irritation as needed.    Contains text generated by [employer].   "

## 2024-03-05 ENCOUNTER — Other Ambulatory Visit (HOSPITAL_BASED_OUTPATIENT_CLINIC_OR_DEPARTMENT_OTHER): Payer: Self-pay

## 2024-03-05 MED ORDER — FLUOCINOLONE ACETONIDE 0.01 % OT OIL
5.0000 [drp] | TOPICAL_OIL | Freq: Two times a day (BID) | OTIC | 0 refills | Status: AC
  Filled 2024-03-05: qty 20, 10d supply, fill #0

## 2024-03-06 ENCOUNTER — Other Ambulatory Visit (HOSPITAL_BASED_OUTPATIENT_CLINIC_OR_DEPARTMENT_OTHER): Payer: Self-pay

## 2024-03-06 MED ORDER — PREDNISONE 50 MG PO TABS
50.0000 mg | ORAL_TABLET | Freq: Every day | ORAL | 0 refills | Status: AC
  Filled 2024-03-06: qty 5, 5d supply, fill #0

## 2024-07-13 ENCOUNTER — Encounter (HOSPITAL_BASED_OUTPATIENT_CLINIC_OR_DEPARTMENT_OTHER): Payer: Self-pay

## 2024-07-13 ENCOUNTER — Ambulatory Visit (HOSPITAL_BASED_OUTPATIENT_CLINIC_OR_DEPARTMENT_OTHER): Admit: 2024-07-13

## 2024-07-20 ENCOUNTER — Ambulatory Visit (INDEPENDENT_AMBULATORY_CARE_PROVIDER_SITE_OTHER)
